# Patient Record
Sex: Male | Born: 1965 | Race: White | Hispanic: No | Marital: Married | Smoking: Never smoker
Health system: Southern US, Community
[De-identification: ages and names within clinical notes are randomized; demographics above are authoritative.]

## PROBLEM LIST (undated history)

## (undated) DIAGNOSIS — I10 Essential (primary) hypertension: Secondary | ICD-10-CM

## (undated) HISTORY — DX: Essential (primary) hypertension: I10

## (undated) HISTORY — PX: KNEE ARTHROSCOPY W/ ACL RECONSTRUCTION: SHX1858

---

## 2019-06-13 DIAGNOSIS — J329 Chronic sinusitis, unspecified: Secondary | ICD-10-CM | POA: Insufficient documentation

## 2020-04-06 ENCOUNTER — Telehealth: Payer: Self-pay | Admitting: Interventional Cardiology

## 2020-04-06 NOTE — Telephone Encounter (Signed)
Melissa from Urgent Care in [place] is faxing over an EKG for Dr. Eldridge Dace to look at. Please contact once reveiewed.

## 2020-04-06 NOTE — Telephone Encounter (Signed)
I spoke with Dr. Wende Crease.  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  No cardiac symptoms.  No old ECG available for comparison.   Patient with NSR and a nonspecific ST cahnge in the lateral leads.  No old ECG in our system for comparison.  May be his baseline tracing.  They could repeat a tracing in a few months and see if there was a change.  No acute cardiac testing needed in the absence of symptoms based on this tracing.

## 2020-06-29 ENCOUNTER — Other Ambulatory Visit: Payer: Self-pay

## 2020-06-29 ENCOUNTER — Ambulatory Visit (INDEPENDENT_AMBULATORY_CARE_PROVIDER_SITE_OTHER): Payer: BLUE CROSS/BLUE SHIELD | Admitting: Family Medicine

## 2020-06-29 VITALS — BP 134/81 | HR 62 | Temp 97.1°F | Resp 18 | Ht 71.0 in | Wt 161.8 lb

## 2020-06-29 DIAGNOSIS — N529 Male erectile dysfunction, unspecified: Secondary | ICD-10-CM

## 2020-06-29 DIAGNOSIS — I10 Essential (primary) hypertension: Secondary | ICD-10-CM

## 2020-06-29 DIAGNOSIS — Z23 Encounter for immunization: Secondary | ICD-10-CM

## 2020-06-29 MED ORDER — LOSARTAN POTASSIUM 100 MG PO TABS: 100.0000 mg | ORAL_TABLET | Freq: Every day | ORAL | 1 refills | Status: DC

## 2020-06-29 MED ORDER — TADALAFIL 10 MG PO TABS: 10.0000 mg | ORAL_TABLET | Freq: Every day | ORAL | 3 refills | Status: DC | PRN

## 2020-06-29 NOTE — Patient Instructions (Addendum)
I appreciate the opportunity to provide you with care for your health and wellness. Today we discussed: establish care   Follow up: Jan for annual visit-labs -fasting appt   No labs or referrals today Orders: Shingles second shot today  Great to meet you today!  Please take care and stay safe while traveling, and good luck with the B&B !  Please continue to practice social distancing to keep you, your family, and our community safe.  If you must go out, please wear a mask and practice good handwashing.  It was a pleasure to see you and I look forward to continuing to work together on your health and well-being. Please do not hesitate to call the office if you need care or have questions about your care.  Have a wonderful day and week. With Gratitude, Tereasa Coop, DNP, AGNP-BC

## 2020-06-29 NOTE — Progress Notes (Addendum)
Subjective:  Patient ID: URBAN NAVAL, male    DOB: October 04, 1966  Age: 54 y.o. MRN: [id number]  CC:  Chief Complaint  Patient presents with   New Patient (Initial Visit)    new pt former pt of nicole fredrickson in [place] just needs to get medication refill on bp med       HPI  HPI Mr. Cush is a 54 year old male patient who presents today to establish care.  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  He is running out of his blood pressure medication which is why he needs a sooner appointment than his scheduled.  He comes in today overall doing well without complaints.  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  He has had his Covid vaccines.  He reports today that he would like to get his shingles vaccine he had his first 1 back in January however he has not had his follow-up on.  He reports he would take the flu shot however he has an egg allergy that has recently developed more frequently into hives.  He says he did a Cologuard about 18 months ago and it was negative.  He does report that he has had HIV and hepatitis C screening and they were negative.  However he does not know the years that he had this done.  He denies having any sleep issues.  He does not see a dentist regularly.  But he has no trouble with chewing or swallowing or dentition changes.  He denies having any trouble going to the bathroom no blood in urine or stool.He is not having any signs of BPH or trouble using the restroom or starting stream.  He denies having any falls or injuries, brain trauma or memory trouble.  Reports some dry skin and the hives as discussed above with the egg allergy.  Does not eat eggs anymore.  Denies having any hearing or vision changes.  He reports he is an avid runner.  Overall has no complaints.  Today patient denies signs and symptoms of COVID 19 infection including fever, chills,  cough, shortness of breath, and headache. Past Medical, Surgical, Social History, Allergies, and Medications have been Reviewed.   Past Medical History:  Diagnosis Date   Hypertension     Current Meds  Medication Sig   losartan (COZAAR) 100 MG tablet Take 1 tablet (100 mg total) by mouth daily.   [DISCONTINUED] losartan (COZAAR) 100 MG tablet Take 100 mg by mouth daily.    ROS:  Review of Systems  Constitutional: Negative.   HENT: Negative.   Eyes: Negative.   Respiratory: Negative.   Cardiovascular: Negative.   Gastrointestinal: Negative.   Genitourinary: Negative.   Musculoskeletal: Negative.   Skin: Negative.   Neurological: Negative.   Endo/Heme/Allergies: Negative.   Psychiatric/Behavioral: Negative.      Objective:   Today's Vitals: BP 134/81 (BP Location: Right Arm, Patient Position: Sitting, Cuff Size: Normal)   Pulse 62   Temp (!) 97.1 F (36.2 C) (Temporal)   Resp 18   Ht 5\' 11"  (1.803 m)   Wt 161 lb 12.8 oz (73.4 kg)   SpO2 99%   BMI 22.57 kg/m  Vitals with BMI 06/29/2020  Height 5\' 11"   Weight 161 lbs 13 oz  BMI 22.58  Systolic 134  Diastolic 81  Pulse 62     Physical Exam  Vitals and nursing note reviewed.  Constitutional:      Appearance: Normal appearance. He is well-developed, well-groomed and normal weight.  HENT:     Head: Normocephalic and atraumatic.     Right Ear: External ear normal.     Left Ear: External ear normal.     Mouth/Throat:     Comments: Mask in place  Eyes:     General:        Right eye: No discharge.        Left eye: No discharge.     Conjunctiva/sclera: Conjunctivae normal.  Cardiovascular:     Rate and Rhythm: Normal rate and regular rhythm.     Pulses: Normal pulses.     Heart sounds: Normal heart sounds.  Pulmonary:     Effort: Pulmonary effort is normal.     Breath sounds: Normal breath sounds.  Musculoskeletal:        General: Normal range of motion.     Cervical back: Normal range of motion and neck  supple.  Skin:    General: Skin is warm.  Neurological:     General: No focal deficit present.     Mental Status: He is alert and oriented to person, place, and time.  Psychiatric:        Attention and Perception: Attention normal.        Mood and Affect: Mood normal.        Speech: Speech normal.        Behavior: Behavior normal. Behavior is cooperative.        Thought Content: Thought content normal.        Cognition and Memory: Cognition normal.        Judgment: Judgment normal.     Comments: Good communication and eye contact      Assessment   1. Essential hypertension   2. Erectile dysfunction, unspecified erectile dysfunction type   3. Need for shingles vaccine     Tests ordered Orders Placed This Encounter  Procedures   Varicella-zoster vaccine IM (Shingrix)     Plan: Please see assessment and plan per problem list above.   Meds ordered this encounter  Medications   losartan (COZAAR) 100 MG tablet    Sig: Take 1 tablet (100 mg total) by mouth daily.    Dispense:  90 tablet    Refill:  1    Order Specific Question:   Supervising Provider    Answer:   Lodema Hong, MARGARET E [[id number]]   tadalafil (CIALIS) 10 MG tablet    Sig: Take 1 tablet (10 mg total) by mouth daily as needed for erectile dysfunction.    Dispense:  10 tablet    Refill:  3    Order Specific Question:   Supervising Provider    Answer:   Kerri Perches [[id number]]    Patient to follow-up in Jan for annual.  Freddy Finner, NP

## 2020-08-21 ENCOUNTER — Other Ambulatory Visit (HOSPITAL_COMMUNITY)
Admission: RE | Admit: 2020-08-21 | Discharge: 2020-08-21 | Disposition: A | Discharge status: Home / Self Care | Payer: BC Managed Care – PPO | Source: Ambulatory Visit | Attending: Family Medicine | Admitting: Family Medicine

## 2020-08-21 ENCOUNTER — Ambulatory Visit (INDEPENDENT_AMBULATORY_CARE_PROVIDER_SITE_OTHER): Payer: Self-pay | Admitting: Family Medicine

## 2020-08-21 ENCOUNTER — Encounter: Payer: Self-pay | Admitting: Family Medicine

## 2020-08-21 ENCOUNTER — Other Ambulatory Visit: Payer: Self-pay

## 2020-08-21 ENCOUNTER — Ambulatory Visit (HOSPITAL_COMMUNITY)
Admission: RE | Admit: 2020-08-21 | Discharge: 2020-08-21 | Disposition: A | Discharge status: Home / Self Care | Payer: BC Managed Care – PPO | Source: Ambulatory Visit | Attending: Family Medicine | Admitting: Family Medicine

## 2020-08-21 VITALS — BP 118/66 | HR 92 | Ht 71.0 in | Wt 157.0 lb

## 2020-08-21 DIAGNOSIS — Z7184 Encounter for health counseling related to travel: Secondary | ICD-10-CM | POA: Diagnosis present

## 2020-08-21 DIAGNOSIS — I10 Essential (primary) hypertension: Secondary | ICD-10-CM

## 2020-08-21 NOTE — Progress Notes (Signed)
Subjective:  Patient ID: Clancy Mullarkey, male    DOB: Nov 18, 1965  Age: 55 y.o. MRN: [id number]  CC:  Chief Complaint  Patient presents with  . FYI    here to get cleared [place] go [place]      HPI  HPI  Mr Amores is a 54 year old male patient of mine.  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  He denies having any other issues or concerns.  He reports that he is resting well.  No trouble with chewing or swallowing.  No trouble using the restroom.  Denies having any chest pain, palpitations, leg swelling, fever or chills, cough, shortness of breath or headache.  Specific labs that he needs are titers for hepatitis B and C, malaria, filaria, syphilis and chest x-ray to rule out tuberculosis.  Today patient denies signs and symptoms of COVID 19 infection including fever, chills, cough, shortness of breath, and headache. Past Medical, Surgical, Social History, Allergies, and Medications have been Reviewed.   History reviewed. No pertinent past medical history.  Current Meds  Medication Sig  . losartan (COZAAR) 100 MG tablet Take 100 mg by mouth daily.    ROS:  Review of Systems  Constitutional: Negative.   HENT: Negative.   Eyes: Negative.   Respiratory: Negative.   Cardiovascular: Negative.   Gastrointestinal: Negative.   Genitourinary: Negative.   Musculoskeletal: Negative.   Skin: Negative.   Neurological: Negative.   Endo/Heme/Allergies: Negative.   Psychiatric/Behavioral: Negative.      Objective:   Today's Vitals: BP 118/66 (BP Location: Left Arm, Patient Position: Sitting, Cuff Size: Normal)   Pulse 92   Ht 5\' 11"  (1.803 m)   Wt 157 lb (71.2 kg)   SpO2 98%   BMI 21.90 kg/m  Vitals with BMI 08/21/2020 06/29/2020  Height 5\' 11"  5\' 11"   Weight 157 lbs 161 lbs 13 oz  BMI 21.91 22.58  Systolic 118 134  Diastolic 66 81  Pulse 92 62     Physical Exam Vitals and nursing note reviewed.  Constitutional:       Appearance: Normal appearance. He is well-developed, well-groomed and normal weight.  HENT:     Head: Normocephalic and atraumatic.     Right Ear: External ear normal.     Left Ear: External ear normal.     Mouth/Throat:     Comments: Mask in place Eyes:     General:        Right eye: No discharge.        Left eye: No discharge.     Conjunctiva/sclera: Conjunctivae normal.  Cardiovascular:     Rate and Rhythm: Normal rate and regular rhythm.     Pulses: Normal pulses.     Heart sounds: Normal heart sounds.  Pulmonary:     Effort: Pulmonary effort is normal.     Breath sounds: Normal breath sounds.  Musculoskeletal:        General: Normal range of motion.     Cervical back: Normal range of motion and neck supple.  Skin:    General: Skin is warm.  Neurological:     General: No focal deficit present.     Mental Status: He is alert and oriented to person, place, and time.  Psychiatric:        Attention and Perception: Attention normal.        Mood and Affect: Mood normal.        Speech:  Speech normal.        Behavior: Behavior normal. Behavior is cooperative.        Thought Content: Thought content normal.        Cognition and Memory: Cognition normal.        Judgment: Judgment normal.      Assessment   1. Encounter for counseling for travel     Tests ordered Orders Placed This Encounter  Procedures  . DG Chest 2 View  . Hepatitis C Antibody  . Hepatitis B Surface AntiBODY  . Parasite Exam, Blood  . RPR  . FilariaAntibody (IgG4)     Plan: Please see assessment and plan per problem list above.   No orders of the defined types were placed in this encounter.   Patient to follow-up in 11/16/2020  Note: This dictation was prepared with Dragon dictation along with smaller phrase technology. Similar sounding words can be transcribed inadequately or may not be corrected upon review. Any transcriptional errors that result from this process are unintentional.       Freddy Finner, NP

## 2020-08-21 NOTE — Assessment & Plan Note (Signed)
Controlled, continue current medication as directed.  DASH diet and exercise are recommended.

## 2020-08-21 NOTE — Addendum Note (Signed)
Addended by: Jerilynn Mages on: 08/21/2020 02:01 PM   Modules accepted: Orders

## 2020-08-21 NOTE — Patient Instructions (Addendum)
  HAPPY FALL!  I appreciate the opportunity to provide you with care for your health and wellness. Today we discussed: Work Scientist, research (life sciences)   Follow up: as scheduled  Labs- today at [facility]  No referrals today   Please continue to practice social distancing to keep you, your family, and our community safe.  If you must go out, please wear a mask and practice good handwashing.  It was a pleasure to see you and I look forward to continuing to work together on your health and well-being. Please do not hesitate to call the office if you need care or have questions about your care.  Have a wonderful day and week. With Gratitude, Tereasa Coop, DNP, AGNP-BC

## 2020-08-21 NOTE — Assessment & Plan Note (Signed)
Labs as per request with the sent paperwork ordered today.  No signs or symptoms of having any infection today.

## 2020-08-22 LAB — IGG 4: IgG, Subclass 4: 28 mg/dL (ref 2–96)

## 2020-08-22 LAB — HEPATITIS C ANTIBODY: HCV Ab: NONREACTIVE

## 2020-08-22 LAB — HEPATITIS B SURFACE ANTIBODY,QUALITATIVE: Hep B S Ab: REACTIVE — AB

## 2020-08-22 LAB — RPR: RPR Ser Ql: NONREACTIVE

## 2020-08-22 LAB — PARASITE EXAM SCREEN, BLOOD-W CONF TO LABCORP (NOT @ ARMC)

## 2020-08-23 LAB — PARASITE EXAM, BLOOD

## 2020-08-28 ENCOUNTER — Other Ambulatory Visit: Payer: Self-pay

## 2020-08-28 DIAGNOSIS — Z7184 Encounter for health counseling related to travel: Secondary | ICD-10-CM

## 2020-08-29 LAB — HIV ANTIBODY (ROUTINE TESTING W REFLEX): HIV 1&2 Ab, 4th Generation: NONREACTIVE

## 2020-08-29 LAB — FILARIA ANTIBODY (IGG4): Filaria Antibody (IgG4): 0.28

## 2020-11-13 ENCOUNTER — Encounter: Payer: Self-pay | Admitting: Family Medicine

## 2020-11-16 ENCOUNTER — Encounter: Payer: Self-pay | Admitting: Family Medicine

## 2020-11-30 ENCOUNTER — Encounter: Payer: Self-pay | Admitting: Family Medicine

## 2020-12-14 ENCOUNTER — Encounter: Payer: Self-pay | Admitting: Internal Medicine

## 2021-01-02 ENCOUNTER — Ambulatory Visit: Payer: Self-pay | Admitting: Family Medicine

## 2021-01-25 ENCOUNTER — Other Ambulatory Visit: Payer: Self-pay

## 2021-01-25 ENCOUNTER — Telehealth: Payer: Self-pay

## 2021-01-25 MED ORDER — LOSARTAN POTASSIUM 100 MG PO TABS
100.0000 mg | ORAL_TABLET | Freq: Every day | ORAL | 5 refills | Status: DC
Start: 2021-01-25 — End: 2021-05-09

## 2021-01-25 MED ORDER — TADALAFIL 10 MG PO TABS
10.0000 mg | ORAL_TABLET | Freq: Every day | ORAL | 0 refills | Status: DC | PRN
Start: 2021-01-25 — End: 2021-03-13

## 2021-01-25 NOTE — Telephone Encounter (Signed)
Patient called need med refill  losartan (COZAAR) 100 MG tablet   Cailis 10 mg  Pharmacy:  [facility]

## 2021-03-13 ENCOUNTER — Other Ambulatory Visit: Payer: Self-pay | Admitting: Family Medicine

## 2021-04-10 ENCOUNTER — Other Ambulatory Visit: Payer: Self-pay | Admitting: Nurse Practitioner

## 2021-05-03 ENCOUNTER — Ambulatory Visit: Payer: BC Managed Care – PPO | Admitting: Family Medicine

## 2021-05-09 ENCOUNTER — Encounter: Payer: Self-pay | Admitting: Nurse Practitioner

## 2021-05-09 ENCOUNTER — Other Ambulatory Visit: Payer: Self-pay

## 2021-05-09 ENCOUNTER — Ambulatory Visit (INDEPENDENT_AMBULATORY_CARE_PROVIDER_SITE_OTHER): Payer: BC Managed Care – PPO | Admitting: Nurse Practitioner

## 2021-05-09 VITALS — BP 146/84 | HR 60 | Temp 97.0°F | Ht 71.0 in | Wt 156.0 lb

## 2021-05-09 DIAGNOSIS — E785 Hyperlipidemia, unspecified: Secondary | ICD-10-CM

## 2021-05-09 DIAGNOSIS — I10 Essential (primary) hypertension: Secondary | ICD-10-CM

## 2021-05-09 DIAGNOSIS — Z0001 Encounter for general adult medical examination with abnormal findings: Secondary | ICD-10-CM

## 2021-05-09 DIAGNOSIS — N529 Male erectile dysfunction, unspecified: Secondary | ICD-10-CM

## 2021-05-09 DIAGNOSIS — Z1211 Encounter for screening for malignant neoplasm of colon: Secondary | ICD-10-CM | POA: Diagnosis not present

## 2021-05-09 MED ORDER — LOSARTAN POTASSIUM 100 MG PO TABS: 100.0000 mg | ORAL_TABLET | Freq: Every day | ORAL | 3 refills | Status: DC

## 2021-05-09 MED ORDER — TADALAFIL 5 MG PO TABS: 5.0000 mg | ORAL_TABLET | Freq: Every day | ORAL | 1 refills | Status: DC

## 2021-05-09 NOTE — Patient Instructions (Signed)
BP was slightly elevated at 146/84. The goal BP is < 140/90. Check your BP occasionally at home, and if it is > 140/90, we may need to add more medication.  We will check cholesterol as well as PSA with your labs today.

## 2021-05-09 NOTE — Assessment & Plan Note (Signed)
-  takes losartan 100 mg daily

## 2021-05-09 NOTE — Progress Notes (Signed)
Established Patient Office Visit  Subjective:  Patient ID: Richard Matthews, male    DOB: 02-14-1966  Age: 55 y.o. MRN: [id number]  CC:  Chief Complaint  Patient presents with   Annual Exam    CPE    HPI [name] presents for physical exam.  Wants to swap to daily 5 mg cialis.  No acute concerns.   No recent labs.  Past Medical History:  Diagnosis Date   Hypertension     Past Surgical History:  Procedure Laterality Date   KNEE ARTHROSCOPY W/ ACL RECONSTRUCTION Right     Family History  Problem Relation Age of Onset   AAA (abdominal aortic aneurysm) Mother    Cancer Father     Social History   Socioeconomic History   Marital status: Married    Spouse name: Not on file   Number of children: 2   Years of education: Not on file   Highest education level: Not on file  Occupational History   Occupation: Runs an Building surveyor  Tobacco Use   Smoking status: Never   Smokeless tobacco: Never  Vaping Use   Vaping Use: Never used  Substance and Sexual Activity   Alcohol use: Yes    Comment: 15 per week; at least 2 per night   Drug use: Yes    Types: Marijuana   Sexual activity: Yes  Other Topics Concern   Not on file  Social History Narrative   Not on file   Social Determinants of Health   Financial Resource Strain: Low Risk    Difficulty of Paying Living Expenses: Not hard at all  Food Insecurity: No Food Insecurity   Worried About Charity fundraiser in the Last Year: Never true   [place] in the Last Year: Never true  Transportation Needs: No Transportation Needs   Lack of Transportation (Medical): No   Lack of Transportation (Non-Medical): No  Physical Activity: Sufficiently Active   Days of Exercise per Week: 7 days   Minutes of Exercise per Session: 40 min  Stress: No Stress Concern Present   Feeling of Stress : Not at all  Social Connections: Moderately Isolated   Frequency of Communication with Friends and Family:  More than three times a week   Frequency of Social Gatherings with Friends and Family: More than three times a week   Attends Religious Services: Never   Marine scientist or Organizations: No   Attends Music therapist: Never   Marital Status: Married  Human resources officer Violence: Not At Risk   Fear of Current or Ex-Partner: No   Emotionally Abused: No   Physically Abused: No   Sexually Abused: No    Outpatient Medications Prior to Visit  Medication Sig Dispense Refill   losartan (COZAAR) 100 MG tablet Take 1 tablet (100 mg total) by mouth daily. 30 tablet 5   tadalafil (CIALIS) 10 MG tablet TAKE 1 TABLET BY MOUTH ONCE DAILY AS NEEDED 10 tablet 0   No facility-administered medications prior to visit.    Allergies  Allergen Reactions   Levofloxacin Hives   Other Hives   Eggs Or Egg-Derived Products Hives    ROS Review of Systems    Objective:    Physical Exam  BP (!) 146/84 (BP Location: Left Arm, Patient Position: Sitting, Cuff Size: Large)   Pulse 60   Temp (!) 97 F (36.1 C) (Temporal)   Ht '5\' 11"'  (1.803 m)   Wt  156 lb (70.8 kg)   SpO2 98%   BMI 21.76 kg/m  Wt Readings from Last 3 Encounters:  05/09/21 156 lb (70.8 kg)  08/21/20 157 lb (71.2 kg)  06/29/20 161 lb 12.8 oz (73.4 kg)     Health Maintenance Due  Topic Date Due   TETANUS/TDAP  Never done   COLONOSCOPY (Pts 45-73yr Insurance coverage will need to be confirmed)  Never done    There are no preventive care reminders to display for this patient.  No results found for: TSH No results found for: WBC, HGB, HCT, MCV, PLT No results found for: NA, K, CHLORIDE, CO2, GLUCOSE, BUN, CREATININE, BILITOT, ALKPHOS, AST, ALT, PROT, ALBUMIN, CALCIUM, ANIONGAP, EGFR, GFR No results found for: CHOL No results found for: HDL No results found for: LDLCALC No results found for: TRIG No results found for: CHOLHDL No results found for: HGBA1C    Assessment & Plan:   Problem List Items  Addressed This Visit       Cardiovascular and Mediastinum   Essential hypertension    -takes losartan 100 mg daily       Relevant Medications   losartan (COZAAR) 100 MG tablet   tadalafil (CIALIS) 5 MG tablet   Other Relevant Orders   CBC with Differential/Platelet   CMP14+EGFR   Lipid Panel With LDL/HDL Ratio     Other   Erectile dysfunction    -takes tadalafil 10 mg PRN       Relevant Medications   tadalafil (CIALIS) 5 MG tablet   Encounter for general adult medical examination with abnormal findings - Primary    -BP slightly elevated today       Relevant Orders   CBC with Differential/Platelet   CMP14+EGFR   Lipid Panel With LDL/HDL Ratio   TSH   Cologuard   PSA   HLD (hyperlipidemia)    -drawing labs today -he states cholesterol is usually 240; no recent labs to compare       Relevant Medications   losartan (COZAAR) 100 MG tablet   tadalafil (CIALIS) 5 MG tablet   Other Visit Diagnoses     Screen for colon cancer       Relevant Orders   Cologuard       Meds ordered this encounter  Medications   losartan (COZAAR) 100 MG tablet    Sig: Take 1 tablet (100 mg total) by mouth daily.    Dispense:  90 tablet    Refill:  3   tadalafil (CIALIS) 5 MG tablet    Sig: Take 1 tablet (5 mg total) by mouth daily.    Dispense:  90 tablet    Refill:  1     Follow-up: Return in about 1 year (around 05/09/2022) for Physical Exam.    JNoreene Larsson NP

## 2021-05-09 NOTE — Assessment & Plan Note (Signed)
-  takes tadalafil 10 mg PRN

## 2021-05-09 NOTE — Assessment & Plan Note (Signed)
-  drawing labs today -he states cholesterol is usually 240; no recent labs to compare

## 2021-05-09 NOTE — Assessment & Plan Note (Signed)
-  BP slightly elevated today

## 2021-05-10 LAB — CMP14+EGFR
ALT: 22 IU/L (ref 0–44)
AST: 23 IU/L (ref 0–40)
Albumin/Globulin Ratio: 2.8 — ABNORMAL HIGH (ref 1.2–2.2)
Albumin: 4.7 g/dL (ref 3.8–4.9)
Alkaline Phosphatase: 56 IU/L (ref 44–121)
BUN/Creatinine Ratio: 16 (ref 9–20)
BUN: 16 mg/dL (ref 6–24)
Bilirubin Total: 0.4 mg/dL (ref 0.0–1.2)
CO2: 26 mmol/L (ref 20–29)
Calcium: 9.3 mg/dL (ref 8.7–10.2)
Chloride: 98 mmol/L (ref 96–106)
Creatinine, Ser: 0.97 mg/dL (ref 0.76–1.27)
Globulin, Total: 1.7 g/dL (ref 1.5–4.5)
Glucose: 102 mg/dL — ABNORMAL HIGH (ref 65–99)
Potassium: 5.2 mmol/L (ref 3.5–5.2)
Sodium: 138 mmol/L (ref 134–144)
Total Protein: 6.4 g/dL (ref 6.0–8.5)
eGFR: 92 mL/min/{1.73_m2} (ref >59)

## 2021-05-10 LAB — CBC WITH DIFFERENTIAL/PLATELET
Basophils Absolute: 0.1 10*3/uL (ref 0.0–0.2)
Basos: 1 %
EOS (ABSOLUTE): 0.3 10*3/uL (ref 0.0–0.4)
Eos: 5 %
Hematocrit: 46.6 % (ref 37.5–51.0)
Hemoglobin: 15.5 g/dL (ref 13.0–17.7)
Immature Grans (Abs): 0 10*3/uL (ref 0.0–0.1)
Immature Granulocytes: 0 %
Lymphocytes Absolute: 1.4 10*3/uL (ref 0.7–3.1)
Lymphs: 25 %
MCH: 31 pg (ref 26.6–33.0)
MCHC: 33.3 g/dL (ref 31.5–35.7)
MCV: 93 fL (ref 79–97)
Monocytes Absolute: 0.6 10*3/uL (ref 0.1–0.9)
Monocytes: 10 %
Neutrophils Absolute: 3.2 10*3/uL (ref 1.4–7.0)
Neutrophils: 59 %
Platelets: 144 10*3/uL — ABNORMAL LOW (ref 150–450)
RBC: 5 x10E6/uL (ref 4.14–5.80)
RDW: 12.8 % (ref 11.6–15.4)
WBC: 5.5 10*3/uL (ref 3.4–10.8)

## 2021-05-10 LAB — TSH: TSH: 2.44 u[IU]/mL (ref 0.450–4.500)

## 2021-05-10 LAB — LIPID PANEL WITH LDL/HDL RATIO
Cholesterol, Total: 219 mg/dL — ABNORMAL HIGH (ref 100–199)
HDL: 66 mg/dL (ref >39)
LDL Chol Calc (NIH): 140 mg/dL — ABNORMAL HIGH (ref 0–99)
LDL/HDL Ratio: 2.1 ratio (ref 0.0–3.6)
Triglycerides: 72 mg/dL (ref 0–149)
VLDL Cholesterol Cal: 13 mg/dL (ref 5–40)

## 2021-05-10 LAB — PSA: Prostate Specific Ag, Serum: 1.6 ng/mL (ref 0.0–4.0)

## 2021-05-10 NOTE — Progress Notes (Signed)
LDL, or bad cholesterol, is 140, and we would like this under 100. Would he like me to send in a low dose of atorvastatin to help bring this down?  Glucose was 102, which is slightly elevated, so I added an A1c to screen for diabetes.

## 2021-05-14 LAB — SPECIMEN STATUS REPORT

## 2021-05-14 LAB — HGB A1C W/O EAG: Hgb A1c MFr Bld: 5.6 % (ref 4.8–5.6)

## 2021-05-14 NOTE — Progress Notes (Signed)
A1c is 5.6, so he isn't even prediabetic.

## 2021-05-21 ENCOUNTER — Telehealth: Payer: Self-pay | Admitting: Nurse Practitioner

## 2021-05-21 NOTE — Telephone Encounter (Signed)
Pt is calling regarding statin medication , can someone call him

## 2021-05-21 NOTE — Telephone Encounter (Signed)
Left message

## 2021-05-23 NOTE — Telephone Encounter (Signed)
Patient called said please send in whatever statin medication he needs to [facility]

## 2021-05-27 ENCOUNTER — Other Ambulatory Visit: Payer: Self-pay | Admitting: Nurse Practitioner

## 2021-05-27 DIAGNOSIS — E785 Hyperlipidemia, unspecified: Secondary | ICD-10-CM

## 2021-05-27 MED ORDER — ATORVASTATIN CALCIUM 20 MG PO TABS: 20.0000 mg | ORAL_TABLET | Freq: Every day | ORAL | 3 refills | Status: DC

## 2021-05-27 NOTE — Telephone Encounter (Signed)
I sent in low-dose atorvastatin.

## 2021-05-28 NOTE — Telephone Encounter (Signed)
Left message

## 2021-05-29 ENCOUNTER — Encounter: Payer: Self-pay | Admitting: Nurse Practitioner

## 2021-05-29 NOTE — Telephone Encounter (Signed)
Left detailed message informing pt.

## 2021-06-04 LAB — COLOGUARD

## 2021-08-15 ENCOUNTER — Other Ambulatory Visit: Payer: Self-pay | Admitting: Internal Medicine

## 2021-08-15 ENCOUNTER — Encounter: Payer: Self-pay | Admitting: Nurse Practitioner

## 2021-08-15 DIAGNOSIS — N529 Male erectile dysfunction, unspecified: Secondary | ICD-10-CM

## 2021-08-15 MED ORDER — SILDENAFIL CITRATE 100 MG PO TABS: 50.0000 mg | ORAL_TABLET | Freq: Every day | ORAL | 11 refills | Status: DC | PRN

## 2021-10-14 IMAGING — DX DG CHEST 2V
2 series · 2 of 2 positions shown · non-contrast
Comparison: None.

CLINICAL DATA: To rule out TB for work the subtract vol. History of
hypertension.

EXAM:
CHEST - 2 VIEW

[chest pa]
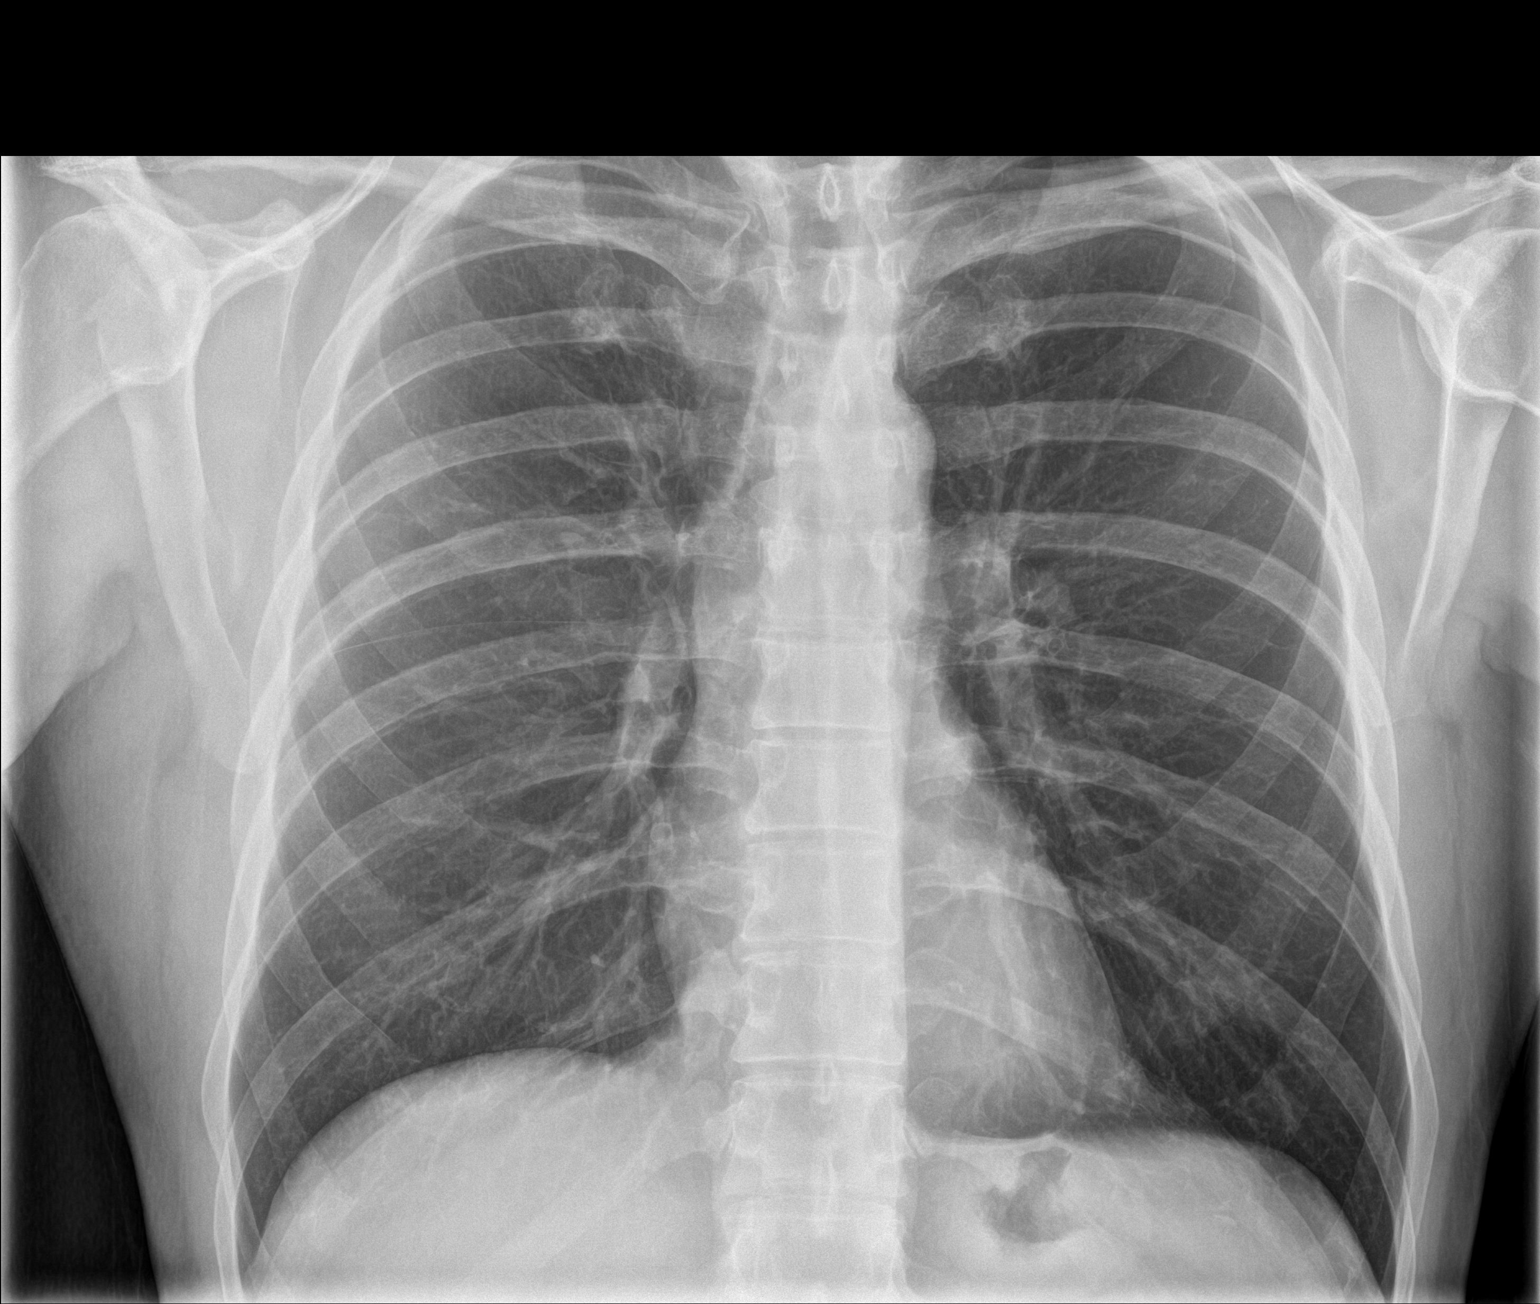

[chest lat]
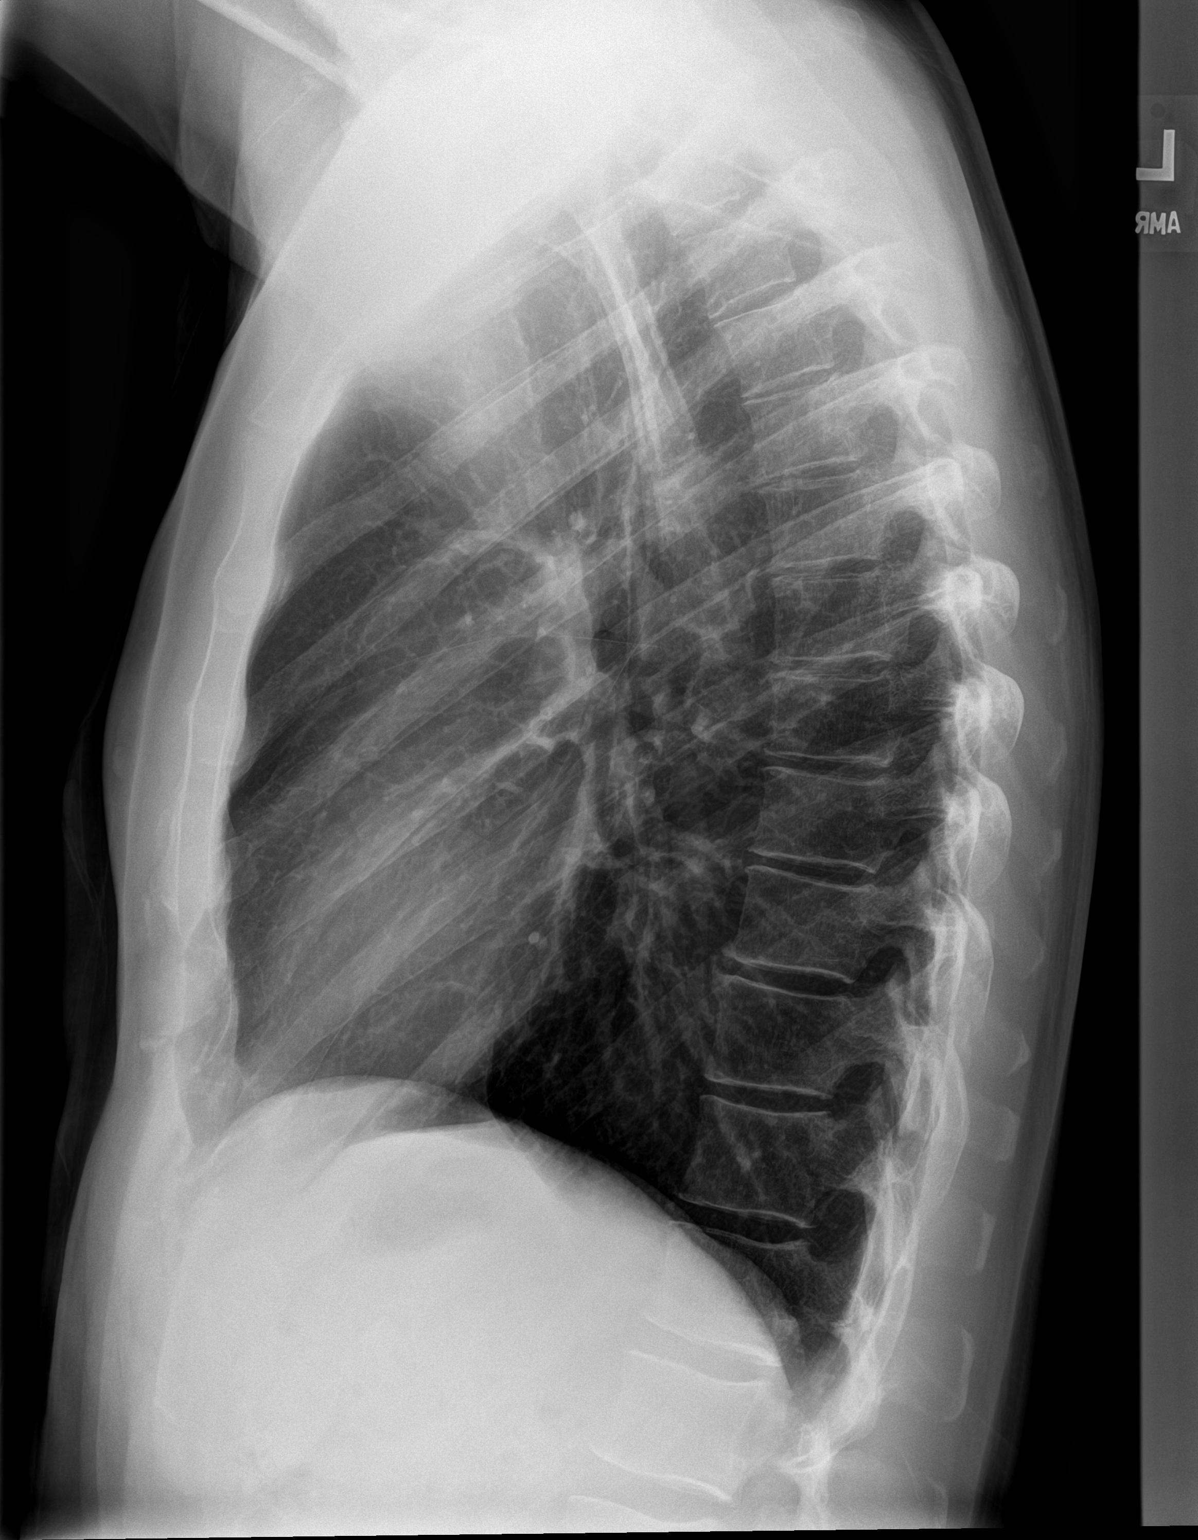

[2 of 2 positions shown; findings below may reference images not displayed]

FINDINGS: Hyperinflation in the lungs. Heart size and pulmonary vascularity
are normal. Lungs are clear. No pleural effusions. No pneumothorax.
No pleural thickening, nodular infiltrates, or consolidation to
suggest active TB. Mediastinal contours appear intact.
IMPRESSION: Hyperinflation. No evidence of active pulmonary disease. No evidence
of active TB.

## 2022-03-28 ENCOUNTER — Ambulatory Visit (INDEPENDENT_AMBULATORY_CARE_PROVIDER_SITE_OTHER): Payer: Managed Care, Other (non HMO) | Admitting: Nurse Practitioner

## 2022-03-28 ENCOUNTER — Encounter: Payer: Self-pay | Admitting: Nurse Practitioner

## 2022-03-28 VITALS — BP 124/74 | HR 59 | Ht 71.0 in | Wt 157.0 lb

## 2022-03-28 DIAGNOSIS — E785 Hyperlipidemia, unspecified: Secondary | ICD-10-CM

## 2022-03-28 DIAGNOSIS — M25561 Pain in right knee: Secondary | ICD-10-CM

## 2022-03-28 DIAGNOSIS — I10 Essential (primary) hypertension: Secondary | ICD-10-CM

## 2022-03-28 MED ORDER — IBUPROFEN 600 MG PO TABS: 600.0000 mg | ORAL_TABLET | Freq: Three times a day (TID) | ORAL | 0 refills | Status: DC | PRN

## 2022-03-28 NOTE — Patient Instructions (Signed)
Pleas take ibuprofen 600 mg every 8 hours as needed for your knee pain    It is important that you exercise regularly at least 30 minutes 5 times a week.  Think about what you will eat, plan ahead. Choose " clean, green, fresh or frozen" over canned, processed or packaged foods which are more sugary, salty and fatty. 70 to 75% of food eaten should be vegetables and fruit. Three meals at set times with snacks allowed between meals, but they must be fruit or vegetables. Aim to eat over a 12 hour period , example 7 am to 7 pm, and STOP after  your last meal of the day. Drink water,generally about 64 ounces per day, no other drink is as healthy. Fruit juice is best enjoyed in a healthy way, by EATING the fruit.  Thanks for choosing [facility], we consider it a privelige to serve you.

## 2022-03-28 NOTE — Assessment & Plan Note (Signed)
Acute pain no effusion noted Start ibuprofen 600 mg 3 times daily as needed Will refer to orthopedics if pain does not improve

## 2022-03-28 NOTE — Progress Notes (Addendum)
   Richard Matthews     MRN: [id number]      DOB: 08-May-1966   HPI Richard Matthews is here for complaints of right knee joint pain and swelling for the past 2 weeks.  Patient stated that had ACL repair surgery 13 years ago, after  he was was involved in a skiing accident.  Patient stated that he has not taken any medication yet for his pain. Has shotting pain 4/10 with flexion of the knee.  Patient denies fever, chills, malaise numbness, tingling.  Hypertension.  Currently taking losartan 100 mg daily by mouth, reports that he has been taking medication as prescribed.  Denies chest pain, syncope, edema  Taking atorvastatin 20 mg daily for hyperlipidemia.  Patient states that he has had Tdap vaccine in the past 10 years, due for colon cancer screening states that he has received Cologuard testing kits and would have the test done soon.   ROS Denies recent fever or chills. Denies sinus pressure, nasal congestion, ear pain or sore throat. Denies chest congestion, productive cough or wheezing. Denies chest pains, palpitations and leg swelling Denies abdominal pain, nausea, vomiting,diarrhea or constipation.   Denies depression, anxiety or insomnia.    PE  BP 124/74 (BP Location: Right Arm, Patient Position: Sitting, Cuff Size: Large)   Pulse (!) 59   Ht 5\' 11"  (1.803 m)   Wt 157 lb (71.2 kg)   SpO2 95%   BMI 21.90 kg/m   Patient alert and oriented and in no cardiopulmonary distress.  Chest: Clear to auscultation bilaterally.  CVS: S1, S2 no murmurs, no S3.Regular rate.  ABD: Soft non tender.   Ext: No edema  MS: Adequate ROM spine, shoulders, hips and knees  Psych: Good eye contact, normal affect. Memory intact not anxious or depressed appearing.  CNS: CN 2-12 intact, power,  normal throughout.no focal deficits noted.   Assessment & Plan  Essential hypertension BP Readings from Last 3 Encounters:  03/28/22 124/74  05/09/21 (!) 146/84  08/21/20 118/66  Chronic  condition well-controlled on losartan 100 mg daily Continue current medication DASH diet advised engage in regular daily exercises at least 150 minutes weekly CMP ordered  HLD (hyperlipidemia) Currently on atorvastatin 20 mg daily Check lipid panel Avoid fatty fried foods  Acute pain of right knee Acute pain no effusion noted Start ibuprofen 600 mg 3 times daily as needed Will refer to orthopedics if pain does not improve

## 2022-03-28 NOTE — Assessment & Plan Note (Signed)
Currently on atorvastatin 20 mg daily Check lipid panel Avoid fatty fried foods 

## 2022-03-28 NOTE — Assessment & Plan Note (Signed)
BP Readings from Last 3 Encounters:  03/28/22 124/74  05/09/21 (!) 146/84  08/21/20 118/66  Chronic condition well-controlled on losartan 100 mg daily Continue current medication DASH diet advised engage in regular daily exercises at least 150 minutes weekly CMP ordered

## 2022-04-01 ENCOUNTER — Telehealth: Payer: Self-pay | Admitting: Nurse Practitioner

## 2022-04-01 ENCOUNTER — Other Ambulatory Visit: Payer: Self-pay | Admitting: Nurse Practitioner

## 2022-04-01 DIAGNOSIS — M25561 Pain in right knee: Secondary | ICD-10-CM

## 2022-04-01 NOTE — Telephone Encounter (Signed)
Please advise 

## 2022-04-01 NOTE — Telephone Encounter (Signed)
Pt came into office rt knee pain is getting worse & is wanting to know if he can please get a referral to [facility]? Prefers [place].

## 2022-04-01 NOTE — Progress Notes (Unsigned)
Amb

## 2022-04-01 NOTE — Telephone Encounter (Signed)
Spoke with pt advised referral placed, pt verbalized understanding

## 2022-04-02 LAB — LIPID PANEL
Chol/HDL Ratio: 2.4 ratio (ref 0.0–5.0)
Cholesterol, Total: 146 mg/dL (ref 100–199)
HDL: 61 mg/dL (ref >39)
LDL Chol Calc (NIH): 71 mg/dL (ref 0–99)
Triglycerides: 71 mg/dL (ref 0–149)
VLDL Cholesterol Cal: 14 mg/dL (ref 5–40)

## 2022-04-02 LAB — CMP14+EGFR
ALT: 21 IU/L (ref 0–44)
AST: 23 IU/L (ref 0–40)
Albumin/Globulin Ratio: 2 (ref 1.2–2.2)
Albumin: 4.2 g/dL (ref 3.8–4.9)
Alkaline Phosphatase: 59 IU/L (ref 44–121)
BUN/Creatinine Ratio: 10 (ref 9–20)
BUN: 12 mg/dL (ref 6–24)
Bilirubin Total: 0.4 mg/dL (ref 0.0–1.2)
CO2: 25 mmol/L (ref 20–29)
Calcium: 9.1 mg/dL (ref 8.7–10.2)
Chloride: 101 mmol/L (ref 96–106)
Creatinine, Ser: 1.15 mg/dL (ref 0.76–1.27)
Globulin, Total: 2.1 g/dL (ref 1.5–4.5)
Glucose: 93 mg/dL (ref 70–99)
Potassium: 4.7 mmol/L (ref 3.5–5.2)
Sodium: 140 mmol/L (ref 134–144)
Total Protein: 6.3 g/dL (ref 6.0–8.5)
eGFR: 74 mL/min/{1.73_m2} (ref >59)

## 2022-04-02 NOTE — Progress Notes (Signed)
Normal labs continue current medications.

## 2022-04-03 LAB — COLOGUARD: Cologuard: NEGATIVE

## 2022-04-07 ENCOUNTER — Encounter: Payer: Self-pay | Admitting: Orthopedic Surgery

## 2022-04-07 ENCOUNTER — Ambulatory Visit (INDEPENDENT_AMBULATORY_CARE_PROVIDER_SITE_OTHER): Payer: Managed Care, Other (non HMO) | Admitting: Orthopedic Surgery

## 2022-04-07 ENCOUNTER — Ambulatory Visit: Payer: Managed Care, Other (non HMO)

## 2022-04-07 VITALS — BP 130/74 | HR 54 | Ht 71.0 in | Wt 156.0 lb

## 2022-04-07 DIAGNOSIS — M25462 Effusion, left knee: Secondary | ICD-10-CM

## 2022-04-07 DIAGNOSIS — M25561 Pain in right knee: Secondary | ICD-10-CM

## 2022-04-07 NOTE — Progress Notes (Signed)
Chief Complaint  Patient presents with   Knee Pain    Right knee pain   This is a 56 year old male status post ACL reconstruction in 2008 with an allograft.  He also notes that he had some meniscal work done and his MCL was injured but healed primarily without repair  He was initially injured in a skiing accident requiring the surgery  He was doing well up until he built a ramp in his backyard and since that time he has had severe pain medial side swelling decreased range of motion which is relieved with 600 mg of ibuprofen but if he does not take the ibuprofen he notes severe pain again  He does not feel instability  His examination is notable for 2+ effusion he has full if not hyperextension and flexes the knee to 105 degrees he has a stable anterior drawer test normal Lachman test no collateral ligament instability but has some medial joint line tenderness along the meniscal rim  X-rays of the right knee 2 screws in good position from acl surgery, no real arthritic changes.  Assessment  56 year old male successful ACL reconstruction in 2008 presents now with atraumatic onset of pain in his right knee associated with swelling and decreased range of motion after he built a deck  Diagnoses  Encounter Diagnoses  Name Primary?   Acute pain of right knee Yes   Effusion of knee joint, left      Plan  Procedure note injection and aspiration right knee joint  Verbal consent was obtained to aspirate and inject the right knee joint   Timeout was completed to confirm the site of aspiration and injection  An 18-gauge needle was used to aspirate the knee joint from a suprapatellar lateral approach.  The medications used were 40 mg of Depo-Medrol and 1% lidocaine 3 cc  Anesthesia was provided by ethyl chloride and the skin was prepped with alcohol.  After cleaning the skin with alcohol an 18-gauge needle was used to aspirate the right knee joint.  We obtained 20  cc of fluid clr yel  thick   We follow this by injection of 40 mg of Depo-Medrol and 3 cc 1% lidocaine.  There were no complications. A sterile bandage was applied.   Follow-up in 2 weeks  Recommend he continue to ice and take ibuprofen since it seems to be taking care of the pain

## 2022-04-07 NOTE — Patient Instructions (Signed)

## 2022-04-10 ENCOUNTER — Encounter: Payer: Self-pay | Admitting: *Deleted

## 2022-04-15 ENCOUNTER — Ambulatory Visit: Payer: Managed Care, Other (non HMO) | Admitting: Orthopedic Surgery

## 2022-04-21 ENCOUNTER — Ambulatory Visit (INDEPENDENT_AMBULATORY_CARE_PROVIDER_SITE_OTHER): Payer: Managed Care, Other (non HMO) | Admitting: Orthopedic Surgery

## 2022-04-21 DIAGNOSIS — M25561 Pain in right knee: Secondary | ICD-10-CM

## 2022-04-21 DIAGNOSIS — M25461 Effusion, right knee: Secondary | ICD-10-CM | POA: Diagnosis not present

## 2022-04-21 NOTE — Patient Instructions (Addendum)
MRI RIGHT KNEE   Call and schedule your MRI for 2 weeks from now. We will work on getting it approved through [employer].  Central Scheduling [phone]

## 2022-04-21 NOTE — Progress Notes (Signed)
Chief Complaint  Patient presents with   Knee Pain    RT knee/ pain is worse Hx of surgery 15 years ago   Richard Matthews is 56 years old he had an ACL reconstruction with an allograft in 2008 he had some meniscal work done and tore his MCL as well.  He presented with pain and swelling after building a ramp in his backyard.  He took ibuprofen for several weeks presented with a 2+ effusion flexion only to 105 degrees.  His ACL felt stable at the time collateral ligaments were stable.  We aspirated 20 cc of clear yellow fluid injected him with Depo-Medrol 40 mg.  He was good for about 10 days and then despite ice and ibuprofen and the swelling returned  Right knee effusion, flexion arc is decreased at 105 degrees,  Recommend MRI right knee  Communicate thru my chart results  Encounter Diagnosis  Name Primary?   Effusion, right knee Yes

## 2022-04-22 ENCOUNTER — Ambulatory Visit (HOSPITAL_COMMUNITY)
Admission: RE | Admit: 2022-04-22 | Discharge: 2022-04-22 | Disposition: A | Discharge status: Home / Self Care | Payer: Managed Care, Other (non HMO) | Source: Ambulatory Visit | Attending: Orthopedic Surgery | Admitting: Orthopedic Surgery

## 2022-04-22 DIAGNOSIS — M25461 Effusion, right knee: Secondary | ICD-10-CM

## 2022-04-22 DIAGNOSIS — M25561 Pain in right knee: Secondary | ICD-10-CM

## 2022-05-09 ENCOUNTER — Telehealth: Payer: Self-pay | Admitting: Radiology

## 2022-05-09 NOTE — Telephone Encounter (Signed)
Right knee arthroscopy with medial menisectomy on 7/21 at 730   No more cases that day bec of clinic

## 2022-05-09 NOTE — Telephone Encounter (Signed)
Patient called, received MyChart message from Dr Romeo Apple, wants to discuss scheduling surgery.  If possible wants to do on July 20th or 21st.  If not will have to wait until early August.  Please call him to discuss.  Thanks.

## 2022-05-14 ENCOUNTER — Other Ambulatory Visit: Payer: Self-pay | Admitting: Orthopedic Surgery

## 2022-05-14 ENCOUNTER — Encounter: Payer: Self-pay | Admitting: Orthopedic Surgery

## 2022-05-14 DIAGNOSIS — S83241D Other tear of medial meniscus, current injury, right knee, subsequent encounter: Secondary | ICD-10-CM

## 2022-05-14 DIAGNOSIS — Z01818 Encounter for other preprocedural examination: Secondary | ICD-10-CM

## 2022-05-14 DIAGNOSIS — I10 Essential (primary) hypertension: Secondary | ICD-10-CM

## 2022-05-14 NOTE — Telephone Encounter (Signed)
Sent him the preop information Put in order s

## 2022-05-16 ENCOUNTER — Other Ambulatory Visit: Payer: Self-pay | Admitting: Orthopedic Surgery

## 2022-05-16 ENCOUNTER — Telehealth: Payer: Self-pay | Admitting: Radiology

## 2022-05-16 NOTE — Telephone Encounter (Signed)
Richard Matthews has denied coverage of the knee scope planned for 05/23/22 he has to wait 3 months after he first saw you for treatment  There is a peer to peer available do you want me to set up a peer to peer or let him know he has to wait 2 more months?

## 2022-05-16 NOTE — Telephone Encounter (Signed)
Peer to peer

## 2022-05-19 ENCOUNTER — Encounter: Payer: Self-pay | Admitting: Radiology

## 2022-05-19 ENCOUNTER — Other Ambulatory Visit: Payer: Self-pay | Admitting: Orthopedic Surgery

## 2022-05-19 DIAGNOSIS — S83241D Other tear of medial meniscus, current injury, right knee, subsequent encounter: Secondary | ICD-10-CM

## 2022-05-20 NOTE — Patient Instructions (Addendum)
Richard Matthews  05/20/2022     @PREFPERIOPPHARMACY @   Your procedure is scheduled on  05/23/2022.   Report to [facility] at  0600  A.M.   Call this number if you have problems the morning of surgery:  [phone]   Remember:  Do not eat or drink after midnight.      Take these medicines the morning of surgery with A SIP OF WATER                                           None.     Do not wear jewelry, make-up or nail polish.  Do not wear lotions, powders, or perfumes, or deodorant.  Do not shave 48 hours prior to surgery.  Men may shave face and neck.  Do not bring valuables to the hospital.  [facility] is not responsible for any belongings or valuables.  Contacts, dentures or bridgework may not be worn into surgery.  Leave your suitcase in the car.  After surgery it may be brought to your room.  For patients admitted to the hospital, discharge time will be determined by your treatment team.  Patients discharged the day of surgery will not be allowed to drive home and must have someone with them for 24 hours.    Special instructions:   DO NOT smoke tobacco or vape for 24 hours before your procedure.  Please read over the following fact sheets that you were given. Coughing and Deep Breathing, Surgical Site Infection Prevention, Anesthesia Post-op Instructions, and Care and Recovery After Surgery       General Anesthesia, Adult, Care After This sheet gives you information about how to care for yourself after your procedure. Your health care provider may also give you more specific instructions. If you have problems or questions, contact your health care provider. What can I expect after the procedure? After the procedure, the following side effects are common: Pain or discomfort at the IV site. Nausea. Vomiting. Sore throat. Trouble concentrating. Feeling cold or chills. Feeling weak or tired. Sleepiness and fatigue. Soreness and body aches.  These side effects can affect parts of the body that were not involved in surgery. Follow these instructions at home: For the time period you were told by your health care provider:  Rest. Do not participate in activities where you could fall or become injured. Do not drive or use machinery. Do not drink alcohol. Do not take sleeping pills or medicines that cause drowsiness. Do not make important decisions or sign legal documents. Do not take care of children on your own. Eating and drinking Follow any instructions from your health care provider about eating or drinking restrictions. When you feel hungry, start by eating small amounts of foods that are soft and easy to digest (bland), such as toast. Gradually return to your regular diet. Drink enough fluid to keep your urine pale yellow. If you vomit, rehydrate by drinking water, juice, or clear broth. General instructions If you have sleep apnea, surgery and certain medicines can increase your risk for breathing problems. Follow instructions from your health care provider about wearing your sleep device: Anytime you are sleeping, including during daytime naps. While taking prescription pain medicines, sleeping medicines, or medicines that make you drowsy. Have a responsible adult stay with you for the time you are told.  It is important to have someone help care for you until you are awake and alert. Return to your normal activities as told by your health care provider. Ask your health care provider what activities are safe for you. Take over-the-counter and prescription medicines only as told by your health care provider. If you smoke, do not smoke without supervision. Keep all follow-up visits as told by your health care provider. This is important. Contact a health care provider if: You have nausea or vomiting that does not get better with medicine. You cannot eat or drink without vomiting. You have pain that does not get better with  medicine. You are unable to pass urine. You develop a skin rash. You have a fever. You have redness around your IV site that gets worse. Get help right away if: You have difficulty breathing. You have chest pain. You have blood in your urine or stool, or you vomit blood. Summary After the procedure, it is common to have a sore throat or nausea. It is also common to feel tired. Have a responsible adult stay with you for the time you are told. It is important to have someone help care for you until you are awake and alert. When you feel hungry, start by eating small amounts of foods that are soft and easy to digest (bland), such as toast. Gradually return to your regular diet. Drink enough fluid to keep your urine pale yellow. Return to your normal activities as told by your health care provider. Ask your health care provider what activities are safe for you. This information is not intended to replace advice given to you by your health care provider. Make sure you discuss any questions you have with your health care provider. Document Revised: 07/05/2020 Document Reviewed: 02/02/2020 Elsevier Patient Education  2023 [employer] How to Use Chlorhexidine for Bathing Chlorhexidine gluconate (CHG) is a germ-killing (antiseptic) solution that is used to clean the skin. It can get rid of the bacteria that normally live on the skin and can keep them away for about 24 hours. To clean your skin with CHG, you may be given: A CHG solution to use in the shower or as part of a sponge bath. A prepackaged cloth that contains CHG. Cleaning your skin with CHG may help lower the risk for infection: While you are staying in the intensive care unit of the hospital. If you have a vascular access, such as a central line, to provide short-term or long-term access to your veins. If you have a catheter to drain urine from your bladder. If you are on a ventilator. A ventilator is a machine that helps you breathe  by moving air in and out of your lungs. After surgery. What are the risks? Risks of using CHG include: A skin reaction. Hearing loss, if CHG gets in your ears and you have a perforated eardrum. Eye injury, if CHG gets in your eyes and is not rinsed out. The CHG product catching fire. Make sure that you avoid smoking and flames after applying CHG to your skin. Do not use CHG: If you have a chlorhexidine allergy or have previously reacted to chlorhexidine. On babies younger than 76 months of age. How to use CHG solution Use CHG only as told by your health care provider, and follow the instructions on the label. Use the full amount of CHG as directed. Usually, this is one bottle. During a shower Follow these steps when using CHG solution during a shower (unless your  health care provider gives you different instructions): Start the shower. Use your normal soap and shampoo to wash your face and hair. Turn off the shower or move out of the shower stream. Pour the CHG onto a clean washcloth. Do not use any type of brush or rough-edged sponge. Starting at your neck, lather your body down to your toes. Make sure you follow these instructions: If you will be having surgery, pay special attention to the part of your body where you will be having surgery. Scrub this area for at least 1 minute. Do not use CHG on your head or face. If the solution gets into your ears or eyes, rinse them well with water. Avoid your genital area. Avoid any areas of skin that have broken skin, cuts, or scrapes. Scrub your back and under your arms. Make sure to wash skin folds. Let the lather sit on your skin for 1-2 minutes or as long as told by your health care provider. Thoroughly rinse your entire body in the shower. Make sure that all body creases and crevices are rinsed well. Dry off with a clean towel. Do not put any substances on your body afterward--such as powder, lotion, or perfume--unless you are told to do so  by your health care provider. Only use lotions that are recommended by the manufacturer. Put on clean clothes or pajamas. If it is the night before your surgery, sleep in clean sheets.  During a sponge bath Follow these steps when using CHG solution during a sponge bath (unless your health care provider gives you different instructions): Use your normal soap and shampoo to wash your face and hair. Pour the CHG onto a clean washcloth. Starting at your neck, lather your body down to your toes. Make sure you follow these instructions: If you will be having surgery, pay special attention to the part of your body where you will be having surgery. Scrub this area for at least 1 minute. Do not use CHG on your head or face. If the solution gets into your ears or eyes, rinse them well with water. Avoid your genital area. Avoid any areas of skin that have broken skin, cuts, or scrapes. Scrub your back and under your arms. Make sure to wash skin folds. Let the lather sit on your skin for 1-2 minutes or as long as told by your health care provider. Using a different clean, wet washcloth, thoroughly rinse your entire body. Make sure that all body creases and crevices are rinsed well. Dry off with a clean towel. Do not put any substances on your body afterward--such as powder, lotion, or perfume--unless you are told to do so by your health care provider. Only use lotions that are recommended by the manufacturer. Put on clean clothes or pajamas. If it is the night before your surgery, sleep in clean sheets. How to use CHG prepackaged cloths Only use CHG cloths as told by your health care provider, and follow the instructions on the label. Use the CHG cloth on clean, dry skin. Do not use the CHG cloth on your head or face unless your health care provider tells you to. When washing with the CHG cloth: Avoid your genital area. Avoid any areas of skin that have broken skin, cuts, or scrapes. Before  surgery Follow these steps when using a CHG cloth to clean before surgery (unless your health care provider gives you different instructions): Using the CHG cloth, vigorously scrub the part of your body where you will be  having surgery. Scrub using a back-and-forth motion for 3 minutes. The area on your body should be completely wet with CHG when you are done scrubbing. Do not rinse. Discard the cloth and let the area air-dry. Do not put any substances on the area afterward, such as powder, lotion, or perfume. Put on clean clothes or pajamas. If it is the night before your surgery, sleep in clean sheets.  For general bathing Follow these steps when using CHG cloths for general bathing (unless your health care provider gives you different instructions). Use a separate CHG cloth for each area of your body. Make sure you wash between any folds of skin and between your fingers and toes. Wash your body in the following order, switching to a new cloth after each step: The front of your neck, shoulders, and chest. Both of your arms, under your arms, and your hands. Your stomach and groin area, avoiding the genitals. Your right leg and foot. Your left leg and foot. The back of your neck, your back, and your buttocks. Do not rinse. Discard the cloth and let the area air-dry. Do not put any substances on your body afterward--such as powder, lotion, or perfume--unless you are told to do so by your health care provider. Only use lotions that are recommended by the manufacturer. Put on clean clothes or pajamas. Contact a health care provider if: Your skin gets irritated after scrubbing. You have questions about using your solution or cloth. You swallow any chlorhexidine. Call your local poison control center [phone] in the [place] Get help right away if: Your eyes itch badly, or they become very red or swollen. Your skin itches badly and is red or swollen. Your hearing changes. You have trouble  seeing. You have swelling or tingling in your mouth or throat. You have trouble breathing. These symptoms may represent a serious problem that is an emergency. Do not wait to see if the symptoms will go away. Get medical help right away. Call your local emergency services (911 in the [place] Do not drive yourself to the hospital. Summary Chlorhexidine gluconate (CHG) is a germ-killing (antiseptic) solution that is used to clean the skin. Cleaning your skin with CHG may help to lower your risk for infection. You may be given CHG to use for bathing. It may be in a bottle or in a prepackaged cloth to use on your skin. Carefully follow your health care provider's instructions and the instructions on the product label. Do not use CHG if you have a chlorhexidine allergy. Contact your health care provider if your skin gets irritated after scrubbing. This information is not intended to replace advice given to you by your health care provider. Make sure you discuss any questions you have with your health care provider. Document Revised: 12/31/2020 Document Reviewed: 12/31/2020 Elsevier Patient Education  2023 [employer].

## 2022-05-21 ENCOUNTER — Encounter (HOSPITAL_COMMUNITY): Payer: Self-pay

## 2022-05-21 ENCOUNTER — Telehealth: Payer: Self-pay | Admitting: Orthopedic Surgery

## 2022-05-21 ENCOUNTER — Encounter (HOSPITAL_COMMUNITY)
Admission: RE | Admit: 2022-05-21 | Discharge: 2022-05-21 | Disposition: A | Discharge status: Home / Self Care | Payer: Managed Care, Other (non HMO) | Source: Ambulatory Visit | Attending: Orthopedic Surgery | Admitting: Orthopedic Surgery

## 2022-05-21 ENCOUNTER — Telehealth: Payer: Self-pay | Admitting: Radiology

## 2022-05-21 ENCOUNTER — Other Ambulatory Visit: Payer: Self-pay

## 2022-05-21 VITALS — BP 121/69 | HR 67 | Temp 97.8°F | Resp 18 | Ht 71.0 in | Wt 156.1 lb

## 2022-05-21 DIAGNOSIS — X58XXXD Exposure to other specified factors, subsequent encounter: Secondary | ICD-10-CM | POA: Insufficient documentation

## 2022-05-21 DIAGNOSIS — Z01818 Encounter for other preprocedural examination: Secondary | ICD-10-CM | POA: Diagnosis present

## 2022-05-21 DIAGNOSIS — I10 Essential (primary) hypertension: Secondary | ICD-10-CM | POA: Insufficient documentation

## 2022-05-21 DIAGNOSIS — S83206D Unspecified tear of unspecified meniscus, current injury, right knee, subsequent encounter: Secondary | ICD-10-CM | POA: Insufficient documentation

## 2022-05-21 DIAGNOSIS — S83241D Other tear of medial meniscus, current injury, right knee, subsequent encounter: Secondary | ICD-10-CM

## 2022-05-21 LAB — BASIC METABOLIC PANEL
Anion gap: 7 (ref 5–15)
BUN: 15 mg/dL (ref 6–20)
CO2: 26 mmol/L (ref 22–32)
Calcium: 8.7 mg/dL — ABNORMAL LOW (ref 8.9–10.3)
Chloride: 106 mmol/L (ref 98–111)
Creatinine, Ser: 1.09 mg/dL (ref 0.61–1.24)
GFR, Estimated: >60 mL/min (ref >60)
Glucose, Bld: 86 mg/dL (ref 70–99)
Potassium: 4.2 mmol/L (ref 3.5–5.1)
Sodium: 139 mmol/L (ref 135–145)

## 2022-05-21 LAB — CBC WITH DIFFERENTIAL/PLATELET
Abs Immature Granulocytes: 0.02 10*3/uL (ref 0.00–0.07)
Basophils Absolute: 0.1 10*3/uL (ref 0.0–0.1)
Basophils Relative: 1 %
Eosinophils Absolute: 0.2 10*3/uL (ref 0.0–0.5)
Eosinophils Relative: 2 %
HCT: 40.7 % (ref 39.0–52.0)
Hemoglobin: 13.9 g/dL (ref 13.0–17.0)
Immature Granulocytes: 0 %
Lymphocytes Relative: 15 %
Lymphs Abs: 1.3 10*3/uL (ref 0.7–4.0)
MCH: 31.8 pg (ref 26.0–34.0)
MCHC: 34.2 g/dL (ref 30.0–36.0)
MCV: 93.1 fL (ref 80.0–100.0)
Monocytes Absolute: 0.8 10*3/uL (ref 0.1–1.0)
Monocytes Relative: 9 %
Neutro Abs: 6.4 10*3/uL (ref 1.7–7.7)
Neutrophils Relative %: 73 %
Platelets: 239 10*3/uL (ref 150–400)
RBC: 4.37 MIL/uL (ref 4.22–5.81)
RDW: 12.9 % (ref 11.5–15.5)
WBC: 8.8 10*3/uL (ref 4.0–10.5)
nRBC: 0 % (ref 0.0–0.2)

## 2022-05-21 NOTE — Telephone Encounter (Signed)
Patient came to office following his pre-op appointment for surgery scheduled for Friday, 05/23/22 for knee arthroscopy - states then went home to check mail, and received a letter from [employer] - which we have copied and placed in box - dated 05/16/22, indicating that surgery has been denied by [place].   - called our administrator Toniann Fail, and to clinic staff who are out of our office this afternoon.     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     ---please advise.

## 2022-05-21 NOTE — Telephone Encounter (Signed)
-----   Message from Caffie Damme, RT sent at 05/21/2022 11:42 AM EDT ----- Regarding: RE: Call back from [facility] It was approved information is in the chart  ----- Message ----- From: Doristine Section Sent: 05/21/2022  11:10 AM EDT To: Caffie Damme, RT Subject: Call back from [facility]         RE: KAYVAN, HOEFLING [[id number]] - call rec'd from [place] at [facility] stating that pt's surgery authorization has been denied in to surgery scheduled 05/23/22. Her phone # is [phone] ext [phone]

## 2022-05-21 NOTE — Telephone Encounter (Signed)
-----   Message from Doristine Section sent at 05/21/2022 11:07 AM EDT ----- Regarding: Call back from [facility] RE: MAMORU, TAKESHITA [[id number]] - call rec'd from [place] at [facility] stating that pt's surgery authorization has been denied in to surgery scheduled 05/23/22. Her phone # is [phone] ext [phone]

## 2022-05-22 NOTE — H&P (Signed)
Richard Matthews is an 56 y.o. male.    Outpatient surgery admission history and physical   Chief Complaint: Painful right knee   HPI: Richard Matthews is 56 years old he presented to [place] June of this year he had had a previous ACL reconstruction in 2008 with an allograft he had some meniscal work done on his knee as well at that time he is not sure if it was repaired or removed and he also had an MCL injury with healed primarily without repair  He was doing well up until he built a ramp in his backyard and since that time he has had worsening medial knee pain decreased range of motion partially relieved by ibuprofen  He did not complain of instability however after Aspiration and injection he still had pain and we eventually obtained an MRI  His MRI showed he had a medial meniscal tear  IMPRESSION: 1. Status post ACL reconstruction.  The graft fibers appear intact. 2. Oblique undersurface tear diffusely involving the posterior horn of the medial meniscus. This is age indeterminate. No comparison MRI is available. 3. Moderate joint effusion. 4. Mild edema and swelling within the subcutaneous fat just anterior to the proximal patellar tendon but normal appearance of the patellar tendon.     Electronically Signed   By: Neita Garnet M.D.   On: 04/23/2022 17:37  Because it was not getting any better and he wanted to return to his preinjury state of health he decided that he wanted to proceed with surgical intervention so we advised that he should have an arthroscopy of the right knee to evaluate the joint and then probably partial medial meniscectomy  Past Medical History:  Diagnosis Date   Hypertension     Past Surgical History:  Procedure Laterality Date   KNEE ARTHROSCOPY W/ ACL RECONSTRUCTION Right     Family History  Problem Relation Age of Onset   AAA (abdominal aortic aneurysm) Mother    Cancer Father    Social History:  reports that he has never smoked. He has never used  smokeless tobacco. He reports current alcohol use of about 2.0 standard drinks of alcohol per week. He reports that he does not currently use drugs.  Allergies:  Allergies  Allergen Reactions   Levofloxacin Hives   Eggs Or Egg-Derived Products Hives    No medications prior to admission.    Results for orders placed or performed during the hospital encounter of 05/21/22 (from the past 48 hour(s))  CBC WITH DIFFERENTIAL     Status: None   Collection Time: 05/21/22  3:30 PM  Result Value Ref Range   WBC 8.8 4.0 - 10.5 K/uL   RBC 4.37 4.22 - 5.81 MIL/uL   Hemoglobin 13.9 13.0 - 17.0 g/dL   HCT 19.1 47.8 - 29.5 %   MCV 93.1 80.0 - 100.0 fL   MCH 31.8 26.0 - 34.0 pg   MCHC 34.2 30.0 - 36.0 g/dL   RDW 62.1 30.8 - 65.7 %   Platelets 239 150 - 400 K/uL   nRBC 0.0 0.0 - 0.2 %   Neutrophils Relative % 73 %   Neutro Abs 6.4 1.7 - 7.7 K/uL   Lymphocytes Relative 15 %   Lymphs Abs 1.3 0.7 - 4.0 K/uL   Monocytes Relative 9 %   Monocytes Absolute 0.8 0.1 - 1.0 K/uL   Eosinophils Relative 2 %   Eosinophils Absolute 0.2 0.0 - 0.5 K/uL   Basophils Relative 1 %   Basophils Absolute  0.1 0.0 - 0.1 K/uL   Immature Granulocytes 0 %   Abs Immature Granulocytes 0.02 0.00 - 0.07 K/uL    Comment: Performed at [facility], [address] [place], [place] [address]  Basic metabolic panel     Status: Abnormal   Collection Time: 05/21/22  3:30 PM  Result Value Ref Range   Sodium 139 135 - 145 mmol/L   Potassium 4.2 3.5 - 5.1 mmol/L   Chloride 106 98 - 111 mmol/L   CO2 26 22 - 32 mmol/L   Glucose, Bld 86 70 - 99 mg/dL    Comment: Glucose reference range applies only to samples taken after fasting for at least 8 hours.   BUN 15 6 - 20 mg/dL   Creatinine, Ser 0.86 0.61 - 1.24 mg/dL   Calcium 8.7 (L) 8.9 - 10.3 mg/dL   GFR, Estimated >57 >84 mL/min    Comment: (NOTE) Calculated using the CKD-EPI Creatinine Equation (2021)    Anion gap 7 5 - 15    Comment: Performed at [facility], [address], [place], [place] [address]   No results found.  Review of Systems  Negative system review, no chest pain shortness of breath no history of back problems fever chills skin rash.  There were no vitals taken for this visit. Physical Exam   Constitutional: Vital signs in the office were stable General appearance: Normal development grooming and hygiene athletic build  Cardiovascular: No evidence of swelling or varicose veins pulses palpated normal with normal temperature without edema or tenderness   Skin normal in all 4 extremities  Neuro: Coordination was normal good deep tendon reflexes equal normal sensation  Psych alert and oriented x3, no evidence of depression anxiety agitation  Musculoskeletal gait slight limp  Right knee The Lachman test seems normal.  He did have some tightness when he flexes his knee prior to the aspiration.  He had some tenderness over the medial joint line I thought his McMurray's sign was equivocal.  His collateral ligaments were stable.  Muscle tone was normal.  Skin incision normal no erythema      Assessment/Plan The patient had an ACL reconstruction with some type of meniscal work were not sure if it was meniscectomy or repair he also had medial collateral ligament injury which healed by itself he was in his usual state of health he started having pain swelling he did have the aspiration and injection he did get better even with ibuprofen.  His MRI shows a medial meniscal tear he still limping  He opted for arthroscopy to evaluate the joint and address the meniscus with meniscectomy if needed  The procedure has been fully reviewed with the patient; The risks and benefits of surgery have been discussed and explained and understood. Alternative treatment has also been reviewed, questions were encouraged and answered. The postoperative plan is also been reviewed.   Fuller Canada, MD 05/22/2022, 5:40 PM

## 2022-05-22 NOTE — Telephone Encounter (Signed)
Surgery approved, Dr Romeo Apple spoke to Doctor at [employer] Everything is approved, reassurance provided, all this is documented in chart I discussed with patient

## 2022-05-23 ENCOUNTER — Ambulatory Visit (HOSPITAL_COMMUNITY)
Admission: RE | Admit: 2022-05-23 | Discharge: 2022-05-23 | Disposition: A | Discharge status: Home / Self Care | Payer: Managed Care, Other (non HMO) | Attending: Orthopedic Surgery | Admitting: Orthopedic Surgery

## 2022-05-23 ENCOUNTER — Encounter (HOSPITAL_COMMUNITY): Payer: Self-pay | Admitting: Orthopedic Surgery

## 2022-05-23 ENCOUNTER — Encounter (HOSPITAL_COMMUNITY)
Admission: RE | Disposition: A | Discharge status: Home / Self Care | Payer: Self-pay | Source: Home / Self Care | Attending: Orthopedic Surgery

## 2022-05-23 ENCOUNTER — Ambulatory Visit (HOSPITAL_COMMUNITY): Payer: Managed Care, Other (non HMO) | Admitting: Anesthesiology

## 2022-05-23 ENCOUNTER — Ambulatory Visit (HOSPITAL_BASED_OUTPATIENT_CLINIC_OR_DEPARTMENT_OTHER): Payer: Managed Care, Other (non HMO) | Admitting: Anesthesiology

## 2022-05-23 ENCOUNTER — Other Ambulatory Visit: Payer: Self-pay

## 2022-05-23 DIAGNOSIS — X58XXXA Exposure to other specified factors, initial encounter: Secondary | ICD-10-CM | POA: Insufficient documentation

## 2022-05-23 DIAGNOSIS — S83241A Other tear of medial meniscus, current injury, right knee, initial encounter: Secondary | ICD-10-CM

## 2022-05-23 DIAGNOSIS — I10 Essential (primary) hypertension: Secondary | ICD-10-CM | POA: Diagnosis not present

## 2022-05-23 DIAGNOSIS — M2540 Effusion, unspecified joint: Secondary | ICD-10-CM | POA: Diagnosis not present

## 2022-05-23 DIAGNOSIS — Z9889 Other specified postprocedural states: Secondary | ICD-10-CM | POA: Insufficient documentation

## 2022-05-23 DIAGNOSIS — S83206A Unspecified tear of unspecified meniscus, current injury, right knee, initial encounter: Secondary | ICD-10-CM

## 2022-05-23 DIAGNOSIS — S83211A Bucket-handle tear of medial meniscus, current injury, right knee, initial encounter: Secondary | ICD-10-CM

## 2022-05-23 HISTORY — PX: KNEE ARTHROSCOPY WITH MEDIAL MENISECTOMY: SHX5651

## 2022-05-23 SURGERY — ARTHROSCOPY, KNEE, WITH MEDIAL MENISCECTOMY
Anesthesia: General | Site: Knee | Laterality: Right

## 2022-05-23 MED ORDER — GLYCOPYRROLATE 0.2 MG/ML IJ SOLN
INTRAMUSCULAR | Status: DC | PRN
  Administered 2022-05-23: .1 mg via INTRAVENOUS

## 2022-05-23 MED ORDER — ORAL CARE MOUTH RINSE: 15.0000 mL | Freq: Once | OROMUCOSAL | Status: AC

## 2022-05-23 MED ORDER — PROPOFOL 10 MG/ML IV BOLUS
INTRAVENOUS | Status: DC | PRN
  Administered 2022-05-23: 170 mg via INTRAVENOUS

## 2022-05-23 MED ORDER — ACETAMINOPHEN 10 MG/ML IV SOLN
INTRAVENOUS | Status: DC | PRN
  Administered 2022-05-23: 1000 mg via INTRAVENOUS

## 2022-05-23 MED ORDER — HYDROCODONE-ACETAMINOPHEN 5-325 MG PO TABS
ORAL_TABLET | ORAL | Status: AC
  Filled 2022-05-23: qty 1

## 2022-05-23 MED ORDER — MEPERIDINE HCL 50 MG/ML IJ SOLN: 6.2500 mg | INTRAMUSCULAR | Status: DC | PRN

## 2022-05-23 MED ORDER — DEXAMETHASONE SODIUM PHOSPHATE 10 MG/ML IJ SOLN
INTRAMUSCULAR | Status: AC
  Filled 2022-05-23: qty 1

## 2022-05-23 MED ORDER — KETOROLAC TROMETHAMINE 30 MG/ML IJ SOLN
INTRAMUSCULAR | Status: AC
  Filled 2022-05-23: qty 1

## 2022-05-23 MED ORDER — PROPOFOL 10 MG/ML IV BOLUS
INTRAVENOUS | Status: AC
  Filled 2022-05-23: qty 20

## 2022-05-23 MED ORDER — HYDROCODONE-ACETAMINOPHEN 5-325 MG PO TABS
1.0000 | ORAL_TABLET | ORAL | 0 refills | Status: AC | PRN
Start: 2022-05-23 — End: 2022-05-28

## 2022-05-23 MED ORDER — ACETAMINOPHEN 10 MG/ML IV SOLN
INTRAVENOUS | Status: AC
  Filled 2022-05-23: qty 100

## 2022-05-23 MED ORDER — BUPIVACAINE-EPINEPHRINE (PF) 0.5% -1:200000 IJ SOLN
INTRAMUSCULAR | Status: AC
  Filled 2022-05-23: qty 60

## 2022-05-23 MED ORDER — MIDAZOLAM HCL 2 MG/2ML IJ SOLN
INTRAMUSCULAR | Status: AC
  Filled 2022-05-23: qty 2

## 2022-05-23 MED ORDER — KETOROLAC TROMETHAMINE 30 MG/ML IJ SOLN
INTRAMUSCULAR | Status: DC | PRN
  Administered 2022-05-23: 30 mg via INTRAVENOUS

## 2022-05-23 MED ORDER — FENTANYL CITRATE (PF) 100 MCG/2ML IJ SOLN
INTRAMUSCULAR | Status: DC | PRN
  Administered 2022-05-23: 50 ug via INTRAVENOUS
  Administered 2022-05-23: 25 ug via INTRAVENOUS
  Administered 2022-05-23: 50 ug via INTRAVENOUS
  Administered 2022-05-23: 25 ug via INTRAVENOUS

## 2022-05-23 MED ORDER — DEXAMETHASONE SODIUM PHOSPHATE 10 MG/ML IJ SOLN: 10.0000 mg | Freq: Once | INTRAMUSCULAR | Status: DC

## 2022-05-23 MED ORDER — BUPIVACAINE-EPINEPHRINE (PF) 0.5% -1:200000 IJ SOLN
INTRAMUSCULAR | Status: DC | PRN
  Administered 2022-05-23: 30 mL

## 2022-05-23 MED ORDER — CHLORHEXIDINE GLUCONATE 0.12 % MT SOLN
15.0000 mL | Freq: Once | OROMUCOSAL | Status: AC
  Administered 2022-05-23: 15 mL via OROMUCOSAL

## 2022-05-23 MED ORDER — LIDOCAINE 2% (20 MG/ML) 5 ML SYRINGE
INTRAMUSCULAR | Status: DC | PRN
  Administered 2022-05-23: 100 mg via INTRAVENOUS

## 2022-05-23 MED ORDER — DEXAMETHASONE SODIUM PHOSPHATE 4 MG/ML IJ SOLN
INTRAMUSCULAR | Status: AC
  Filled 2022-05-23: qty 3

## 2022-05-23 MED ORDER — LIDOCAINE HCL (PF) 2 % IJ SOLN
INTRAMUSCULAR | Status: AC
  Filled 2022-05-23: qty 5

## 2022-05-23 MED ORDER — FENTANYL CITRATE (PF) 250 MCG/5ML IJ SOLN
INTRAMUSCULAR | Status: AC
  Filled 2022-05-23: qty 5

## 2022-05-23 MED ORDER — DEXAMETHASONE SODIUM PHOSPHATE 4 MG/ML IJ SOLN
10.0000 mg | Freq: Once | INTRAMUSCULAR | Status: AC
  Administered 2022-05-23: 10 mg via INTRAVENOUS

## 2022-05-23 MED ORDER — HYDROMORPHONE HCL 1 MG/ML IJ SOLN
0.2500 mg | INTRAMUSCULAR | Status: DC | PRN
  Administered 2022-05-23: 0.5 mg via INTRAVENOUS
  Filled 2022-05-23: qty 0.5

## 2022-05-23 MED ORDER — ONDANSETRON HCL 4 MG/2ML IJ SOLN
4.0000 mg | Freq: Once | INTRAMUSCULAR | Status: DC | PRN
Start: 2022-05-23 — End: 2022-05-23

## 2022-05-23 MED ORDER — ONDANSETRON HCL 4 MG/2ML IJ SOLN
INTRAMUSCULAR | Status: AC
  Filled 2022-05-23: qty 2

## 2022-05-23 MED ORDER — SODIUM CHLORIDE 0.9 % IR SOLN
Status: DC | PRN
  Administered 2022-05-23 (×3): 3000 mL

## 2022-05-23 MED ORDER — HYDROCODONE-ACETAMINOPHEN 5-325 MG PO TABS
1.0000 | ORAL_TABLET | Freq: Once | ORAL | Status: AC
  Administered 2022-05-23: 1 via ORAL

## 2022-05-23 MED ORDER — LACTATED RINGERS IV SOLN
INTRAVENOUS | Status: DC
Start: 2022-05-23 — End: 2022-05-23

## 2022-05-23 MED ORDER — IBUPROFEN 800 MG PO TABS: 800.0000 mg | ORAL_TABLET | Freq: Three times a day (TID) | ORAL | 1 refills | Status: DC | PRN

## 2022-05-23 MED ORDER — ONDANSETRON HCL 4 MG/2ML IJ SOLN
INTRAMUSCULAR | Status: DC | PRN
  Administered 2022-05-23: 4 mg via INTRAVENOUS

## 2022-05-23 MED ORDER — EPINEPHRINE PF 1 MG/ML IJ SOLN
INTRAMUSCULAR | Status: AC
  Filled 2022-05-23: qty 4

## 2022-05-23 MED ORDER — CEFAZOLIN SODIUM-DEXTROSE 2-4 GM/100ML-% IV SOLN
2.0000 g | INTRAVENOUS | Status: AC
  Administered 2022-05-23: 2 g via INTRAVENOUS

## 2022-05-23 MED ORDER — CEFAZOLIN SODIUM-DEXTROSE 2-4 GM/100ML-% IV SOLN
INTRAVENOUS | Status: AC
  Filled 2022-05-23: qty 100

## 2022-05-23 MED ORDER — DEXAMETHASONE SODIUM PHOSPHATE 10 MG/ML IJ SOLN
INTRAMUSCULAR | Status: DC | PRN
  Administered 2022-05-23: 5 mg via INTRAVENOUS

## 2022-05-23 MED ORDER — MIDAZOLAM HCL 5 MG/5ML IJ SOLN
INTRAMUSCULAR | Status: DC | PRN
  Administered 2022-05-23: 2 mg via INTRAVENOUS

## 2022-05-23 SURGICAL SUPPLY — 46 items
APL PRP STRL LF DISP 70% ISPRP (MISCELLANEOUS) ×1
BAG HAMPER (MISCELLANEOUS) ×2 IMPLANT
BLADE SHAVER TORPEDO 4X13 (MISCELLANEOUS) ×1 IMPLANT
BLADE SURG SZ11 CARB STEEL (BLADE) ×2 IMPLANT
BNDG CMPR STD VLCR NS LF 5.8X6 (GAUZE/BANDAGES/DRESSINGS) ×1
BNDG ELASTIC 6X5.8 VLCR NS LF (GAUZE/BANDAGES/DRESSINGS) ×2 IMPLANT
CHLORAPREP W/TINT 26 (MISCELLANEOUS) ×2 IMPLANT
CLOTH BEACON ORANGE TIMEOUT ST (SAFETY) ×2 IMPLANT
COOLER ICEMAN CLASSIC (MISCELLANEOUS) ×2 IMPLANT
DRAPE HALF SHEET 40X57 (DRAPES) ×2 IMPLANT
GAUZE SPONGE 4X4 12PLY STRL (GAUZE/BANDAGES/DRESSINGS) ×2 IMPLANT
GAUZE XEROFORM 5X9 LF (GAUZE/BANDAGES/DRESSINGS) ×2 IMPLANT
GLOVE BIOGEL PI IND STRL 6.5 (GLOVE) IMPLANT
GLOVE BIOGEL PI IND STRL 7.0 (GLOVE) ×2 IMPLANT
GLOVE BIOGEL PI IND STRL 8.5 (GLOVE) IMPLANT
GLOVE BIOGEL PI INDICATOR 6.5 (GLOVE) ×1
GLOVE BIOGEL PI INDICATOR 7.0 (GLOVE) ×2
GLOVE BIOGEL PI INDICATOR 8.5 (GLOVE) ×1
GLOVE SKINSENSE NS SZ8.0 LF (GLOVE) ×1
GLOVE SKINSENSE STRL SZ8.0 LF (GLOVE) IMPLANT
GLOVE SURG SS PI 6.5 STRL IVOR (GLOVE) ×1 IMPLANT
GOWN STRL REUS W/TWL LRG LVL3 (GOWN DISPOSABLE) ×2 IMPLANT
GOWN STRL REUS W/TWL XL LVL3 (GOWN DISPOSABLE) ×2 IMPLANT
IV NS IRRIG 3000ML ARTHROMATIC (IV SOLUTION) ×5 IMPLANT
KIT BLADEGUARD II DBL (SET/KITS/TRAYS/PACK) ×2 IMPLANT
KIT TURNOVER CYSTO (KITS) ×2 IMPLANT
MANIFOLD NEPTUNE II (INSTRUMENTS) ×2 IMPLANT
MARKER SKIN DUAL TIP RULER LAB (MISCELLANEOUS) ×2 IMPLANT
NDL HYPO 18GX1.5 BLUNT FILL (NEEDLE) ×1 IMPLANT
NDL HYPO 21X1.5 SAFETY (NEEDLE) ×1 IMPLANT
NEEDLE HYPO 18GX1.5 BLUNT FILL (NEEDLE) ×2 IMPLANT
NEEDLE HYPO 21X1.5 SAFETY (NEEDLE) ×2 IMPLANT
PACK ARTHRO LIMB DRAPE STRL (MISCELLANEOUS) ×2 IMPLANT
PACK ARTHROSCOPY WL (CUSTOM PROCEDURE TRAY) ×2 IMPLANT
PAD ABD 5X9 TENDERSORB (GAUZE/BANDAGES/DRESSINGS) ×2 IMPLANT
PAD ARMBOARD 7.5X6 YLW CONV (MISCELLANEOUS) ×2 IMPLANT
PAD COLD SHLDR SM WRAP-ON (PAD) ×1 IMPLANT
PAD FOR LEG HOLDER (MISCELLANEOUS) ×2 IMPLANT
PADDING CAST COTTON 6X4 STRL (CAST SUPPLIES) ×2 IMPLANT
SET ARTHROSCOPY INST (INSTRUMENTS) ×2 IMPLANT
SET BASIN LINEN APH (SET/KITS/TRAYS/PACK) ×2 IMPLANT
SUT ETHILON 3 0 FSL (SUTURE) ×2 IMPLANT
SYR 10ML LL (SYRINGE) ×2 IMPLANT
SYR 30ML LL (SYRINGE) ×3 IMPLANT
TUBE CONNECTING 12X1/4 (SUCTIONS) ×3 IMPLANT
TUBING IN/OUT FLOW W/MAIN PUMP (TUBING) ×2 IMPLANT

## 2022-05-23 NOTE — Op Note (Signed)
05/23/2022  8:46 AM  PATIENT:  Richard Matthews  56 y.o. male  PRE-OPERATIVE DIAGNOSIS:  torn medial meniscus right knee  POST-OPERATIVE DIAGNOSIS:  torn medial meniscus right knee  PROCEDURE:  Procedure(s): KNEE ARTHROSCOPY WITH MEDIAL MENISECTOMY (Right)  Surgical findings  ACL graft was intact pristine revascularized in excellent position  The cartilaginous surfaces of the medial lateral compartment and patellofemoral joint normal pristine  Torn medial meniscus posterior horn near the root extending into the posterior leaf of the meniscus.  Extensive synovitis extensive injected synovial tissue  Details of procedure  The patient was taken to the operating room for general anesthesia.  I did a brief exam under anesthesia his ACL graft Lachman test was normal no pivot shift.  Collateral ligament stable.  His passive range of motion test flexion was 115 degrees and he came to full extension he did have effusion  After sterile prep and drape timeout was completed site of surgery and procedure confirmed patient identity confirmed  I made a standard lateral portal and placed the scope in the joint I did a diagnostic arthroscopy.  I listed the findings above.  I made a medial portal with the assistance of a spinal needle.  I placed the probe into the joint and probed the meniscus.  The tear was at the free edge of the root.  It extended into the posterior horn of the meniscus towards the body.  The tear was debrided with a straight arthroscopic biter and the fragments were removed with a straight motorized shaver using the torpedo blade.  The meniscus was further contoured with a torpedo blade and then probed to confirm a stable rim.  The joint had extensive synovitis.  There was scar tissue in the joint from the synovial irritation which I removed with a shaver.  I did not do an extensive synovectomy to avoid causing any excessive bleeding.  I irrigated the joint cleaned out any  small debris and then closed the portals with 3-0 nylon sutures.  I injected Marcaine with epinephrine  The patient was extubated and taken to recovery room in stable condition  I will give him a dose of steroids in the PACU to account for the synovitis and then put him on anti-inflammatories postop for period of 6 weeks  He is weightbearing as tolerated  SURGEON:  Surgeon(s) and Role:    * Imo Cumbie E, MD - Primary  PHYSICIAN ASSISTANT:   ASSISTANTS: none   ANESTHESIA:   general  EBL:  10 mL   BLOOD ADMINISTERED:none  DRAINS: none   LOCAL MEDICATIONS USED:  MARCAINE     SPECIMEN:  No Specimen  DISPOSITION OF SPECIMEN:  N/A  COUNTS:  YES  TOURNIQUET:  * No tourniquets in log *  DICTATION: .Dragon Dictation  PLAN OF CARE: Discharge to home after PACU  PATIENT DISPOSITION:  PACU - hemodynamically stable.   Delay start of Pharmacological VTE agent (>24hrs) due to surgical blood loss or risk of bleeding: not applicable  

## 2022-05-23 NOTE — Brief Op Note (Signed)
05/23/2022  8:40 AM  PATIENT:  Richard Matthews  56 y.o. male  PRE-OPERATIVE DIAGNOSIS:  torn medial meniscus right knee  POST-OPERATIVE DIAGNOSIS:  torn medial meniscus right knee  PROCEDURE:  Procedure(s): KNEE ARTHROSCOPY WITH MEDIAL MENISECTOMY (Right)  Surgical findings  ACL graft was intact pristine revascularized in excellent position  The cartilaginous surfaces of the medial lateral compartment and patellofemoral joint normal pristine  Torn medial meniscus posterior horn near the root extending into the posterior leaf of the meniscus.  Extensive synovitis extensive injected synovial tissue  Details of procedure  The patient was taken to the operating room for general anesthesia.  I did a brief exam under anesthesia his ACL graft Lachman test was normal no pivot shift.  Collateral ligament stable.  His passive range of motion test flexion was 115 degrees and he came to full extension he did have effusion  After sterile prep and drape timeout was completed site of surgery and procedure confirmed patient identity confirmed  I made a standard lateral portal and placed the scope in the joint I did a diagnostic arthroscopy.  I listed the findings above.  I made a medial portal with the assistance of a spinal needle.  I placed the probe into the joint and probed the meniscus.  The tear was at the free edge of the root.  It extended into the posterior horn of the meniscus towards the body.  The tear was debrided with a straight arthroscopic biter and the fragments were removed with a straight motorized shaver using the torpedo blade.  The meniscus was further contoured with a torpedo blade and then probed to confirm a stable rim.  The joint had extensive synovitis.  There was scar tissue in the joint from the synovial irritation which I removed with a shaver.  I did not do an extensive synovectomy to avoid causing any excessive bleeding.  I irrigated the joint cleaned out any  small debris and then closed the portals with 3-0 nylon sutures.  I injected Marcaine with epinephrine  The patient was extubated and taken to recovery room in stable condition  I will give him a dose of steroids in the PACU to account for the synovitis and then put him on anti-inflammatories postop for period of 6 weeks  He is weightbearing as tolerated  SURGEON:  Surgeon(s) and Role:    * [name], MD - Primary  PHYSICIAN ASSISTANT:   ASSISTANTS: none   ANESTHESIA:   general  EBL:  5 mL   BLOOD ADMINISTERED:none  DRAINS: none   LOCAL MEDICATIONS USED:  MARCAINE     SPECIMEN:  No Specimen  DISPOSITION OF SPECIMEN:  N/A  COUNTS:  YES  TOURNIQUET:  * No tourniquets in log *  DICTATION: .Dragon Dictation  PLAN OF CARE: Discharge to home after PACU  PATIENT DISPOSITION:  PACU - hemodynamically stable.   Delay start of Pharmacological VTE agent (>24hrs) due to surgical blood loss or risk of bleeding: not applicable

## 2022-05-23 NOTE — Transfer of Care (Signed)
Immediate Anesthesia Transfer of Care Note  Patient: Richard Matthews  Procedure(s) Performed: KNEE ARTHROSCOPY WITH MEDIAL MENISECTOMY (Right: Knee)  Patient Location: PACU  Anesthesia Type:General  Level of Consciousness: drowsy and patient cooperative  Airway & Oxygen Therapy: Patient Spontanous Breathing  Post-op Assessment: Report given to RN and Post -op Vital signs reviewed and stable  Post vital signs: Reviewed and stable  Last Vitals:  Vitals Value Taken Time  BP 128/80 05/23/22 0846  Temp    Pulse 76 05/23/22 0847  Resp 12 05/23/22 0847  SpO2 99 % 05/23/22 0847  Vitals shown include unvalidated device data.  Last Pain:  Vitals:   05/23/22 0630  TempSrc: Oral  PainSc: 4          Complications: No notable events documented.

## 2022-05-23 NOTE — Interval H&P Note (Signed)
History and Physical Interval Note:  05/23/2022 7:27 AM  Richard Matthews  has presented today for surgery, with the diagnosis of torn medial meniscus right knee.  The various methods of treatment have been discussed with the patient and family. After consideration of risks, benefits and other options for treatment, the patient has consented to  Procedure(s): KNEE ARTHROSCOPY WITH MEDIAL MENISECTOMY (Right) as a surgical intervention.  The patient's history has been reviewed, patient examined, no change in status, stable for surgery.  I have reviewed the patient's chart and labs.  Questions were answered to the patient's satisfaction.     Fuller Canada

## 2022-05-23 NOTE — Anesthesia Procedure Notes (Signed)
Procedure Name: LMA Insertion Date/Time: 05/23/2022 7:42 AM  Performed by: Marny Lowenstein, CRNAPre-anesthesia Checklist: Patient identified, Emergency Drugs available, Suction available and Patient being monitored Patient Re-evaluated:Patient Re-evaluated prior to induction Oxygen Delivery Method: Circle system utilized Preoxygenation: Pre-oxygenation with 100% oxygen Induction Type: IV induction Ventilation: Mask ventilation without difficulty LMA: LMA inserted LMA Size: 4.0 Number of attempts: 1 Placement Confirmation: positive ETCO2 and breath sounds checked- equal and bilateral Tube secured with: Tape Dental Injury: Teeth and Oropharynx as per pre-operative assessment

## 2022-05-23 NOTE — Anesthesia Preprocedure Evaluation (Addendum)
Anesthesia Evaluation  Patient identified by MRN, date of birth, ID band Patient awake    Reviewed: Allergy & Precautions, NPO status , Patient's Chart, lab work & pertinent test results  Airway Mallampati: I  TM Distance: >3 FB Neck ROM: Full    Dental  (+) Dental Advisory Given, Teeth Intact   Pulmonary neg pulmonary ROS,    Pulmonary exam normal breath sounds clear to auscultation       Cardiovascular Exercise Tolerance: Good hypertension, Pt. on medications Normal cardiovascular exam Rhythm:Regular Rate:Normal     Neuro/Psych negative neurological ROS  negative psych ROS   GI/Hepatic negative GI ROS, Neg liver ROS,   Endo/Other  negative endocrine ROS  Renal/GU negative Renal ROS  negative genitourinary   Musculoskeletal negative musculoskeletal ROS (+)   Abdominal   Peds negative pediatric ROS (+)  Hematology negative hematology ROS (+)   Anesthesia Other Findings   Reproductive/Obstetrics negative OB ROS                            Anesthesia Physical Anesthesia Plan  ASA: 2  Anesthesia Plan: General   Post-op Pain Management: Dilaudid IV   Induction: Intravenous  PONV Risk Score and Plan: 3 and Ondansetron and Dexamethasone  Airway Management Planned: LMA  Additional Equipment:   Intra-op Plan:   Post-operative Plan: Extubation in OR  Informed Consent: I have reviewed the patients History and Physical, chart, labs and discussed the procedure including the risks, benefits and alternatives for the proposed anesthesia with the patient or authorized representative who has indicated his/her understanding and acceptance.       Plan Discussed with: CRNA and Surgeon  Anesthesia Plan Comments:         Anesthesia Quick Evaluation

## 2022-05-23 NOTE — Brief Op Note (Signed)
05/23/2022  8:46 AM  PATIENT:  Richard Matthews  56 y.o. male  PRE-OPERATIVE DIAGNOSIS:  torn medial meniscus right knee  POST-OPERATIVE DIAGNOSIS:  torn medial meniscus right knee  PROCEDURE:  Procedure(s): KNEE ARTHROSCOPY WITH MEDIAL MENISECTOMY (Right)  Surgical findings  ACL graft was intact pristine revascularized in excellent position  The cartilaginous surfaces of the medial lateral compartment and patellofemoral joint normal pristine  Torn medial meniscus posterior horn near the root extending into the posterior leaf of the meniscus.  Extensive synovitis extensive injected synovial tissue  Details of procedure  The patient was taken to the operating room for general anesthesia.  I did a brief exam under anesthesia his ACL graft Lachman test was normal no pivot shift.  Collateral ligament stable.  His passive range of motion test flexion was 115 degrees and he came to full extension he did have effusion  After sterile prep and drape timeout was completed site of surgery and procedure confirmed patient identity confirmed  I made a standard lateral portal and placed the scope in the joint I did a diagnostic arthroscopy.  I listed the findings above.  I made a medial portal with the assistance of a spinal needle.  I placed the probe into the joint and probed the meniscus.  The tear was at the free edge of the root.  It extended into the posterior horn of the meniscus towards the body.  The tear was debrided with a straight arthroscopic biter and the fragments were removed with a straight motorized shaver using the torpedo blade.  The meniscus was further contoured with a torpedo blade and then probed to confirm a stable rim.  The joint had extensive synovitis.  There was scar tissue in the joint from the synovial irritation which I removed with a shaver.  I did not do an extensive synovectomy to avoid causing any excessive bleeding.  I irrigated the joint cleaned out any  small debris and then closed the portals with 3-0 nylon sutures.  I injected Marcaine with epinephrine  The patient was extubated and taken to recovery room in stable condition  I will give him a dose of steroids in the PACU to account for the synovitis and then put him on anti-inflammatories postop for period of 6 weeks  He is weightbearing as tolerated  SURGEON:  Surgeon(s) and Role:    * [name], MD - Primary  PHYSICIAN ASSISTANT:   ASSISTANTS: none   ANESTHESIA:   general  EBL:  10 mL   BLOOD ADMINISTERED:none  DRAINS: none   LOCAL MEDICATIONS USED:  MARCAINE     SPECIMEN:  No Specimen  DISPOSITION OF SPECIMEN:  N/A  COUNTS:  YES  TOURNIQUET:  * No tourniquets in log *  DICTATION: .Dragon Dictation  PLAN OF CARE: Discharge to home after PACU  PATIENT DISPOSITION:  PACU - hemodynamically stable.   Delay start of Pharmacological VTE agent (>24hrs) due to surgical blood loss or risk of bleeding: not applicable

## 2022-05-23 NOTE — Anesthesia Postprocedure Evaluation (Signed)
Anesthesia Post Note  Patient: Richard Matthews  Procedure(s) Performed: KNEE ARTHROSCOPY WITH MEDIAL MENISECTOMY (Right: Knee)  Patient location during evaluation: Phase II Anesthesia Type: General Level of consciousness: awake and alert and oriented Pain management: pain level controlled Vital Signs Assessment: post-procedure vital signs reviewed and stable Respiratory status: spontaneous breathing, nonlabored ventilation and respiratory function stable Cardiovascular status: blood pressure returned to baseline and stable Postop Assessment: no apparent nausea or vomiting Anesthetic complications: no   No notable events documented.   Last Vitals:  Vitals:   05/23/22 0924 05/23/22 0927  BP:  (!) 145/76  Pulse:  67  Resp: 15 15  Temp:  36.7 C  SpO2: 98% 97%    Last Pain:  Vitals:   05/23/22 0927  TempSrc: Oral  PainSc: 4                  Carlesha Seiple C Amrie Gurganus

## 2022-05-26 ENCOUNTER — Encounter (HOSPITAL_COMMUNITY): Payer: Self-pay | Admitting: Orthopedic Surgery

## 2022-05-30 ENCOUNTER — Encounter: Payer: Self-pay | Admitting: Orthopedic Surgery

## 2022-05-30 ENCOUNTER — Ambulatory Visit (INDEPENDENT_AMBULATORY_CARE_PROVIDER_SITE_OTHER): Payer: Managed Care, Other (non HMO) | Admitting: Orthopedic Surgery

## 2022-05-30 DIAGNOSIS — Z9889 Other specified postprocedural states: Secondary | ICD-10-CM

## 2022-05-30 NOTE — Progress Notes (Signed)
FOLLOW UP   Encounter Diagnosis  Name Primary?   S/P right knee arthroscopy (05/23/22) Yes     Chief Complaint  Patient presents with   Post-op Follow-up    Right knee 05/23/22      Richard Matthews is doing well he has full extension and 115 degrees of flexion he has no swelling his portals look clean he can start home exercises follow-up in 3 weeks

## 2022-06-19 ENCOUNTER — Encounter: Payer: Self-pay | Admitting: Orthopedic Surgery

## 2022-06-19 ENCOUNTER — Ambulatory Visit (INDEPENDENT_AMBULATORY_CARE_PROVIDER_SITE_OTHER): Payer: Managed Care, Other (non HMO) | Admitting: Orthopedic Surgery

## 2022-06-19 VITALS — Ht 71.0 in | Wt 156.0 lb

## 2022-06-19 DIAGNOSIS — M25461 Effusion, right knee: Secondary | ICD-10-CM

## 2022-06-19 DIAGNOSIS — Z9889 Other specified postprocedural states: Secondary | ICD-10-CM

## 2022-06-19 DIAGNOSIS — S83241D Other tear of medial meniscus, current injury, right knee, subsequent encounter: Secondary | ICD-10-CM

## 2022-06-19 MED ORDER — METHYLPREDNISOLONE ACETATE 40 MG/ML IJ SUSP
40.0000 mg | Freq: Once | INTRAMUSCULAR | Status: AC
  Administered 2022-06-19: 40 mg via INTRA_ARTICULAR

## 2022-06-19 NOTE — Progress Notes (Signed)
FOLLOW UP   Encounter Diagnoses  Name Primary?   S/P right knee arthroscopy (05/23/22) Yes   Acute medial meniscus tear of right knee, subsequent encounter      Chief Complaint  Patient presents with   Routine Post Op    Rt knee scope DOS 05/23/22    Richard Matthews is doing well.  He does have some swelling in his right knee.  It is a little tight.  He is having little discomfort.  He does take his ibuprofen as long as he takes it the swelling seems to stay down  He does have a moderate to large effusion I like to aspirated he is agreeable  Procedure note injection and aspiration right knee joint  Verbal consent was obtained to aspirate and inject the right knee joint   Timeout was completed to confirm the site of aspiration and injection  An 18-gauge needle was used to aspirate the knee joint from a suprapatellar lateral approach.  The medications used were 40 mg of Depo-Medrol and 1% lidocaine 3 cc  Anesthesia was provided by ethyl chloride and the skin was prepped with alcohol.  After cleaning the skin with alcohol an 18-gauge needle was used to aspirate the right knee joint.  We obtained 30 cc of fluid amber-colored  We follow this by injection of 40 mg of Depo-Medrol and 3 cc 1% lidocaine.  There were no complications. A sterile bandage was applied.  I would like to see him in a month he can continue the ibuprofen

## 2022-06-22 ENCOUNTER — Encounter: Payer: Self-pay | Admitting: Nurse Practitioner

## 2022-06-23 ENCOUNTER — Other Ambulatory Visit: Payer: Self-pay

## 2022-06-23 DIAGNOSIS — I10 Essential (primary) hypertension: Secondary | ICD-10-CM

## 2022-06-23 DIAGNOSIS — E785 Hyperlipidemia, unspecified: Secondary | ICD-10-CM

## 2022-06-23 MED ORDER — ATORVASTATIN CALCIUM 20 MG PO TABS: 20.0000 mg | ORAL_TABLET | Freq: Every day | ORAL | 3 refills | Status: DC

## 2022-06-23 MED ORDER — LOSARTAN POTASSIUM 100 MG PO TABS: 100.0000 mg | ORAL_TABLET | Freq: Every day | ORAL | 3 refills | Status: DC

## 2022-07-25 ENCOUNTER — Encounter: Payer: Managed Care, Other (non HMO) | Admitting: Orthopedic Surgery

## 2022-08-04 ENCOUNTER — Ambulatory Visit (INDEPENDENT_AMBULATORY_CARE_PROVIDER_SITE_OTHER): Payer: Managed Care, Other (non HMO) | Admitting: Orthopedic Surgery

## 2022-08-04 ENCOUNTER — Encounter: Payer: Self-pay | Admitting: Orthopedic Surgery

## 2022-08-04 DIAGNOSIS — M25461 Effusion, right knee: Secondary | ICD-10-CM

## 2022-08-04 MED ORDER — METHYLPREDNISOLONE ACETATE 40 MG/ML IJ SUSP
40.0000 mg | Freq: Once | INTRAMUSCULAR | Status: AC
  Administered 2022-08-04: 40 mg via INTRA_ARTICULAR

## 2022-08-04 MED ORDER — CELECOXIB 200 MG PO CAPS: 200.0000 mg | ORAL_CAPSULE | Freq: Every day | ORAL | 5 refills | Status: DC

## 2022-08-04 NOTE — Progress Notes (Signed)
Chief Complaint  Patient presents with   Post-op Follow-up    Knee is improving after [facility] 05/23/22 is running now.     56 year old male status post arthroscopy right knee had a partial medial meniscectomy but also had a significant amount of synovitis unclear why  Patient continues to have swelling in the right knee despite 1 aspiration injection and taking ibuprofen.  He says he takes ibuprofen probably not 3 times a day.  He is able to run about 2 miles when he goes running.  He says he feels better when he is running versus when he is not running  His exam shows he does have a knee effusion with decreased flexion compared to the left knee he has some mild tenderness over the medial side of the joint where the meniscus was resected  Recommended aspiration injection he agreed  We aspirated the knee from the lateral approach and obtained bloody fluid again.  Approximately 15 to 20 cc.  We put in 80 mg of Depo-Medrol this time and start him on Celebrex once a day to decrease the amount of ibuprofen he was taking  He has had no GI symptoms at this time  He can continue with running, use ice after running start the Celebrex once a day let me know if he has any trouble  Otherwise follow-up as needed Meds ordered this encounter  Medications   methylPREDNISolone acetate (DEPO-MEDROL) injection 40 mg   methylPREDNISolone acetate (DEPO-MEDROL) injection 40 mg   celecoxib (CELEBREX) 200 MG capsule    Sig: Take 1 capsule (200 mg total) by mouth daily.    Dispense:  30 capsule    Refill:  5

## 2022-08-20 ENCOUNTER — Encounter: Payer: Self-pay | Admitting: Internal Medicine

## 2022-08-20 ENCOUNTER — Ambulatory Visit (INDEPENDENT_AMBULATORY_CARE_PROVIDER_SITE_OTHER): Payer: Managed Care, Other (non HMO) | Admitting: Internal Medicine

## 2022-08-20 VITALS — BP 108/64 | HR 82 | Ht 71.0 in | Wt 160.0 lb

## 2022-08-20 DIAGNOSIS — J0111 Acute recurrent frontal sinusitis: Secondary | ICD-10-CM

## 2022-08-20 DIAGNOSIS — Z9109 Other allergy status, other than to drugs and biological substances: Secondary | ICD-10-CM

## 2022-08-20 MED ORDER — AMOXICILLIN-POT CLAVULANATE 875-125 MG PO TABS: 1.0000 | ORAL_TABLET | Freq: Two times a day (BID) | ORAL | 0 refills | Status: DC

## 2022-08-20 MED ORDER — CLARITHROMYCIN 500 MG PO TABS: 500.0000 mg | ORAL_TABLET | Freq: Two times a day (BID) | ORAL | 0 refills | Status: DC

## 2022-08-20 MED ORDER — AZELASTINE HCL 0.1 % NA SOLN: 1.0000 | Freq: Two times a day (BID) | NASAL | 5 refills | Status: DC

## 2022-08-20 NOTE — Progress Notes (Signed)
Acute Office Visit  Subjective:    Patient ID: Richard Matthews, male    DOB: 1966/07/07, 56 y.o.   MRN: [id number]  Chief Complaint  Patient presents with   Facial Pain    Since 08/14/22   Cough    Since 08/14/22   Nasal Congestion    Since 08/14/22    HPI Patient is in today for complaint of nasal congestion, postnasal drip, sinus pressure related headache and cough for the last 1 week.  He denies any fever or chills currently.  Denies any dyspnea or wheezing currently.  He has had sinus surgeries in the past.  He has history of recurrent sinusitis, and has to take Augmentin and clarithromycin for proper coverage.  He is allergic to levofloxacin.  Past Medical History:  Diagnosis Date   Hypertension     Past Surgical History:  Procedure Laterality Date   KNEE ARTHROSCOPY W/ ACL RECONSTRUCTION Right    KNEE ARTHROSCOPY WITH MEDIAL MENISECTOMY Right 05/23/2022   Procedure: KNEE ARTHROSCOPY WITH MEDIAL MENISECTOMY;  Surgeon: Carole Civil, MD;  Location: [facility];  Service: Orthopedics;  Laterality: Right;    Family History  Problem Relation Age of Onset   AAA (abdominal aortic aneurysm) Mother    Cancer Father     Social History   Socioeconomic History   Marital status: Married    Spouse name: Not on file   Number of children: 2   Years of education: Not on file   Highest education level: Not on file  Occupational History   Occupation: Runs an Building surveyor  Tobacco Use   Smoking status: Never   Smokeless tobacco: Never  Vaping Use   Vaping Use: Never used  Substance and Sexual Activity   Alcohol use: Yes    Alcohol/week: 2.0 standard drinks of alcohol    Types: 2 Cans of beer per week    Comment: 15 per week; at least 2 per night   Drug use: Not Currently   Sexual activity: Yes  Other Topics Concern   Not on file  Social History Narrative   Not on file   Social Determinants of Health   Financial Resource Strain: Low Risk  (06/29/2020)    Overall Financial Resource Strain (CARDIA)    Difficulty of Paying Living Expenses: Not hard at all  Food Insecurity: No Food Insecurity (06/29/2020)   Hunger Vital Sign    Worried About Running Out of Food in the Last Year: Never true    [place] in the Last Year: Never true  Transportation Needs: No Transportation Needs (06/29/2020)   PRAPARE - Hydrologist (Medical): No    Lack of Transportation (Non-Medical): No  Physical Activity: Sufficiently Active (06/29/2020)   Exercise Vital Sign    Days of Exercise per Week: 7 days    Minutes of Exercise per Session: 40 min  Stress: No Stress Concern Present (06/29/2020)   [place]    Feeling of Stress : Not at all  Social Connections: Moderately Isolated (06/29/2020)   Social Connection and Isolation Panel [NHANES]    Frequency of Communication with Friends and Family: More than three times a week    Frequency of Social Gatherings with Friends and Family: More than three times a week    Attends Religious Services: Never    Marine scientist or Organizations: No    Attends Archivist Meetings: Never  Marital Status: Married  Catering manager Violence: Not At Risk (06/29/2020)   Humiliation, Afraid, Rape, and Kick questionnaire    Fear of Current or Ex-Partner: No    Emotionally Abused: No    Physically Abused: No    Sexually Abused: No    Outpatient Medications Prior to Visit  Medication Sig Dispense Refill   atorvastatin (LIPITOR) 20 MG tablet Take 1 tablet (20 mg total) by mouth daily. 90 tablet 3   celecoxib (CELEBREX) 200 MG capsule Take 1 capsule (200 mg total) by mouth daily. 30 capsule 5   losartan (COZAAR) 100 MG tablet Take 1 tablet (100 mg total) by mouth daily. 90 tablet 3   sildenafil (VIAGRA) 100 MG tablet Take 0.5-1 tablets (50-100 mg total) by mouth daily as needed for erectile dysfunction. 30 tablet 11    ibuprofen (ADVIL) 800 MG tablet Take 1 tablet (800 mg total) by mouth every 8 (eight) hours as needed. (Patient not taking: Reported on 08/20/2022) 90 tablet 1   No facility-administered medications prior to visit.    Allergies  Allergen Reactions   Levofloxacin Hives   Eggs Or Egg-Derived Products Hives    Review of Systems  Constitutional:  Negative for chills and fever.  HENT:  Positive for congestion, postnasal drip, rhinorrhea and sinus pressure.   Respiratory:  Positive for cough. Negative for shortness of breath.   Cardiovascular:  Negative for chest pain and palpitations.  Genitourinary:  Negative for dysuria and hematuria.  Musculoskeletal:  Negative for neck pain and neck stiffness.  Skin:  Negative for rash.  Psychiatric/Behavioral:  Negative for agitation and behavioral problems.        Objective:    Physical Exam Constitutional:      General: He is not in acute distress.    Appearance: He is not diaphoretic.  HENT:     Head: Normocephalic and atraumatic.     Nose: Congestion present.     Right Sinus: Frontal sinus tenderness present.     Left Sinus: Frontal sinus tenderness present.     Mouth/Throat:     Pharynx: Posterior oropharyngeal erythema present.  Eyes:     General: No scleral icterus.    Extraocular Movements: Extraocular movements intact.  Cardiovascular:     Rate and Rhythm: Normal rate and regular rhythm.     Heart sounds: Normal heart sounds. No murmur heard. Pulmonary:     Breath sounds: Normal breath sounds. No wheezing or rales.  Musculoskeletal:     Cervical back: Neck supple. No tenderness.     Right lower leg: No edema.     Left lower leg: No edema.  Skin:    General: Skin is warm.     Findings: No rash.  Neurological:     General: No focal deficit present.     Mental Status: He is alert and oriented to person, place, and time.  Psychiatric:        Mood and Affect: Mood normal.        Behavior: Behavior normal.     BP 108/64  (BP Location: Right Arm, Patient Position: Sitting, Cuff Size: Normal)   Pulse 82   Ht 5\' 11"  (1.803 m)   Wt 160 lb (72.6 kg)   SpO2 96%   BMI 22.32 kg/m  Wt Readings from Last 3 Encounters:  08/20/22 160 lb (72.6 kg)  06/19/22 156 lb (70.8 kg)  05/21/22 156 lb 1.4 oz (70.8 kg)        Assessment & Plan:  Problem List Items Addressed This Visit       Respiratory   Acute recurrent frontal sinusitis - Primary Has history of recurrent and resistant sinusitis Started Augmentin Added clarithromycin for atypical coverage according to the patient's history Nasal saline spray as needed for nasal congestion    Relevant Medications   amoxicillin-clavulanate (AUGMENTIN) 875-125 MG tablet   clarithromycin (BIAXIN) 500 MG tablet   azelastine (ASTELIN) 0.1 % nasal spray   Other Visit Diagnoses     Environmental allergies     Did not tolerate Flonase in the past Added azelastine nasal spray Advised to take Zyrtec or Allegra for allergies   Relevant Medications   azelastine (ASTELIN) 0.1 % nasal spray        Meds ordered this encounter  Medications   amoxicillin-clavulanate (AUGMENTIN) 875-125 MG tablet    Sig: Take 1 tablet by mouth 2 (two) times daily.    Dispense:  14 tablet    Refill:  0   clarithromycin (BIAXIN) 500 MG tablet    Sig: Take 1 tablet (500 mg total) by mouth 2 (two) times daily.    Dispense:  14 tablet    Refill:  0   azelastine (ASTELIN) 0.1 % nasal spray    Sig: Place 1 spray into both nostrils 2 (two) times daily. Use in each nostril as directed    Dispense:  30 mL    Refill:  5     Mariadel Mruk Concha Se, MD

## 2022-09-29 ENCOUNTER — Encounter: Payer: Managed Care, Other (non HMO) | Admitting: Nurse Practitioner

## 2022-09-30 ENCOUNTER — Encounter: Payer: Managed Care, Other (non HMO) | Admitting: Internal Medicine

## 2022-11-07 ENCOUNTER — Other Ambulatory Visit: Payer: Self-pay | Admitting: Internal Medicine

## 2022-11-07 DIAGNOSIS — N529 Male erectile dysfunction, unspecified: Secondary | ICD-10-CM

## 2022-11-12 ENCOUNTER — Other Ambulatory Visit: Payer: Self-pay

## 2022-11-12 ENCOUNTER — Other Ambulatory Visit: Payer: Self-pay | Admitting: Internal Medicine

## 2022-11-12 ENCOUNTER — Telehealth: Payer: Self-pay | Admitting: Internal Medicine

## 2022-11-12 DIAGNOSIS — N529 Male erectile dysfunction, unspecified: Secondary | ICD-10-CM

## 2022-11-12 DIAGNOSIS — E785 Hyperlipidemia, unspecified: Secondary | ICD-10-CM

## 2022-11-12 DIAGNOSIS — I10 Essential (primary) hypertension: Secondary | ICD-10-CM

## 2022-11-12 MED ORDER — ATORVASTATIN CALCIUM 20 MG PO TABS: 20.0000 mg | ORAL_TABLET | Freq: Every day | ORAL | 3 refills | Status: DC

## 2022-11-12 MED ORDER — LOSARTAN POTASSIUM 100 MG PO TABS: 100.0000 mg | ORAL_TABLET | Freq: Every day | ORAL | 3 refills | Status: DC

## 2022-11-12 MED ORDER — SILDENAFIL CITRATE 100 MG PO TABS: 50.0000 mg | ORAL_TABLET | Freq: Every day | ORAL | 11 refills | Status: DC | PRN

## 2022-11-12 NOTE — Telephone Encounter (Signed)
Prescription Request  11/12/2022  Is this a "Controlled Substance" medicine? No  LOV: Visit date not found  What is the name of the medication or equipment?  Losartan (COZAAR) 100 MG tablet   sildenafil (VIAGRA) 100 MG tablet   atorvastatin (LIPITOR) 20 MG tablet   Have you contacted your pharmacy to request a refill? Yes   Which pharmacy would you like this sent to?  [facility] [place] [facility] [id number] [place] [address] Phone: [phone] Fax: [phone]    Patient notified that their request is being sent to the clinical staff for review and that they should receive a response within 2 business days.   Please advise at Home [phone]       CONTACTED [place]

## 2023-01-01 ENCOUNTER — Encounter: Payer: Self-pay | Admitting: Radiology

## 2023-06-15 IMAGING — MR MR KNEE*R* W/O CM
7 series · 40 of 40 positions shown · non-contrast
Comparison: Right knee radiographs 04/07/2022

CLINICAL DATA: Chronic right knee pain.

EXAM:
MRI OF THE RIGHT KNEE WITHOUT CONTRAST
TECHNIQUE: Multiplanar, multisequence MR imaging of the knee was performed. No
intravenous contrast was administered.

[Series 12: T2 fat-sat · axial · right · 4.0mm · 0.47mm/px · z∈[-178,-53]mm · 7 of 26 slices shown (1 of 3)]
[im 1/26]
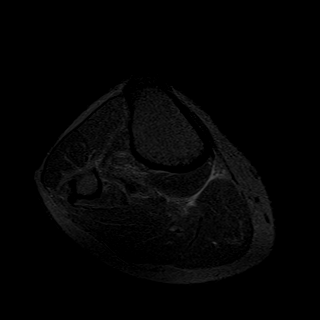
[im 5/26]
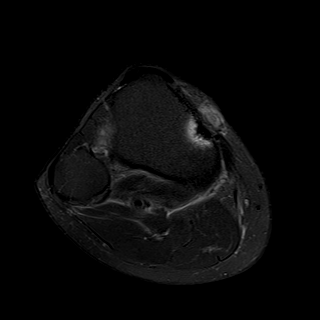
[im 9/26]
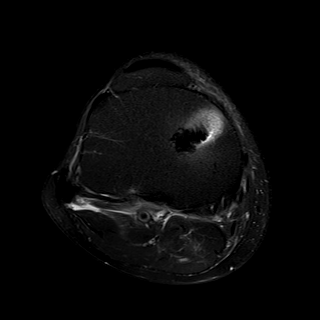
[im 13/26]
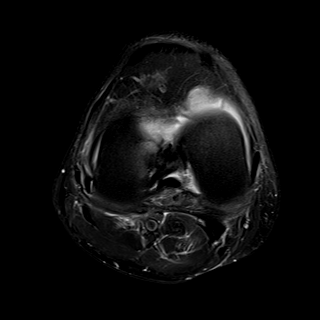
[im 17/26]
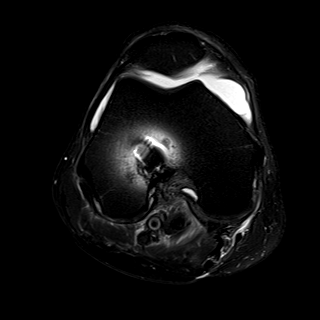
[im 21/26]
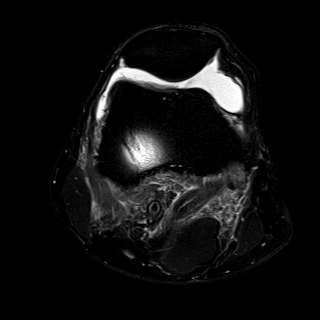
[im 26/26]
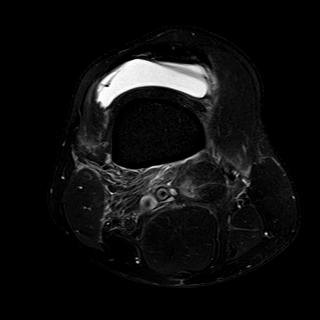

[Series 13: T1 · coronal · right · 4.0mm · 0.59mm/px · 5 of 24 slices shown]
[im 1/24]
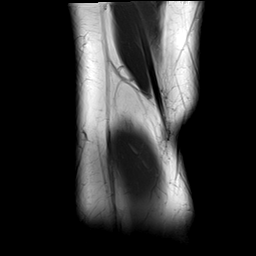
[im 6/24]
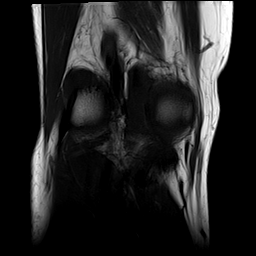
[im 12/24]
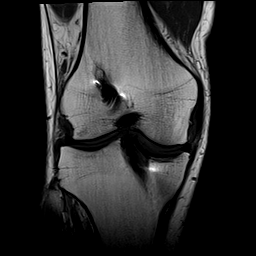
[im 18/24]
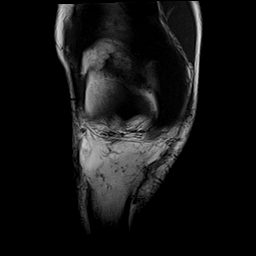
[im 24/24]
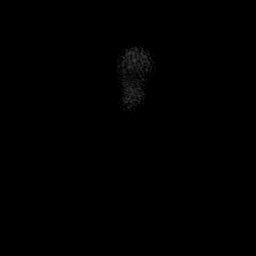

[Series 14: T2 fat-sat · coronal · right · 4.0mm · 0.59mm/px · 6 of 28 slices shown (2 of 3)]
[im 1/28]
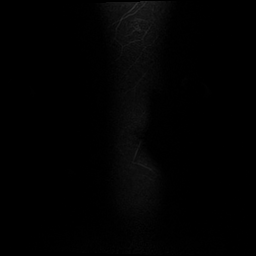
[im 6/28]
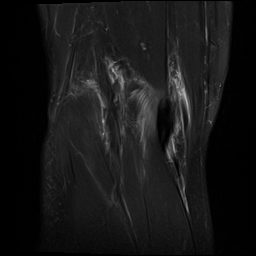
[im 11/28]
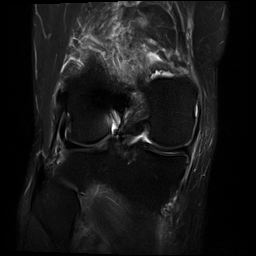
[im 17/28]
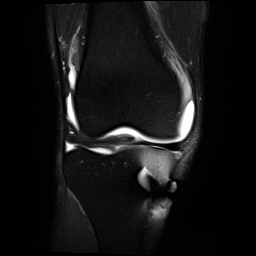
[im 22/28]
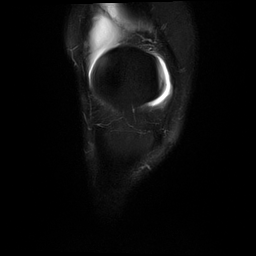
[im 28/28]
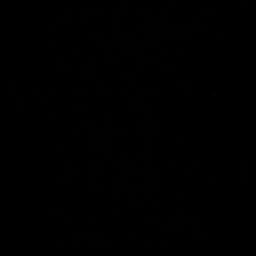

[Series 15: PD fat-sat · coronal · right · 4.0mm · 0.59mm/px · 6 of 28 slices shown (1 of 2)]
[im 1/28]
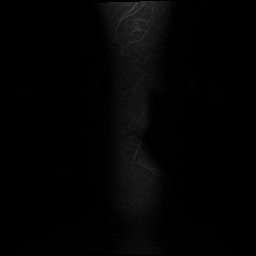
[im 6/28]
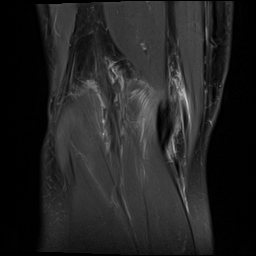
[im 11/28]
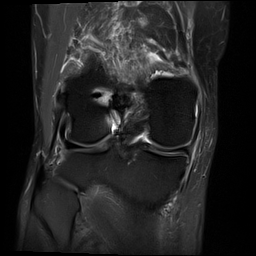
[im 17/28]
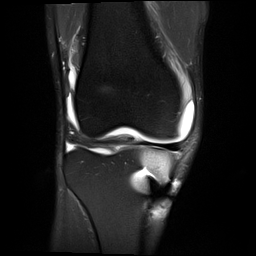
[im 22/28]
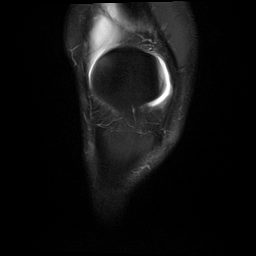
[im 28/28]
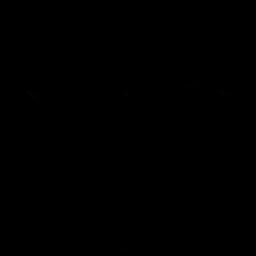

[Series 16: PD fat-sat · sagittal · right · 3.0mm · 0.52mm/px · 8 of 36 slices shown (2 of 2)]
[im 1/36]
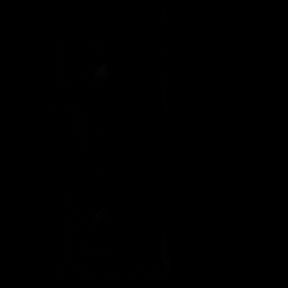
[im 6/36]
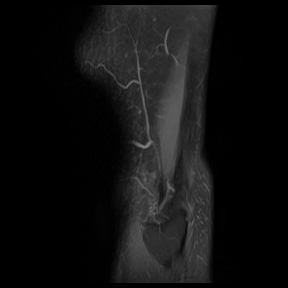
[im 11/36]
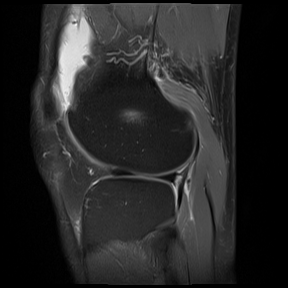
[im 16/36]
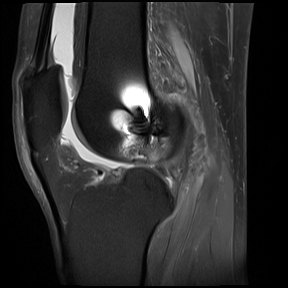
[im 21/36]
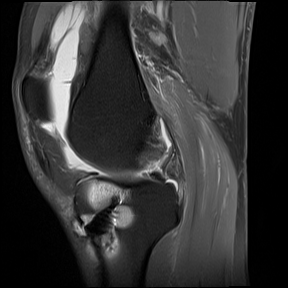
[im 26/36]
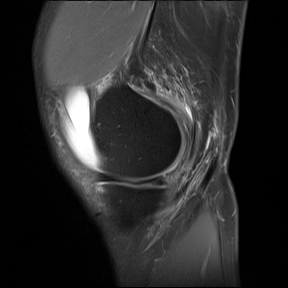
[im 31/36]
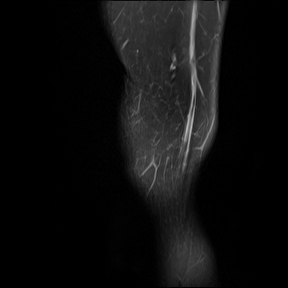
[im 36/36]
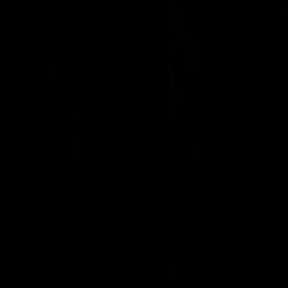

[Series 17: T2 fat-sat · sagittal · right · 3.0mm · 0.59mm/px · 6 of 28 slices shown (3 of 3)]
[im 1/28]
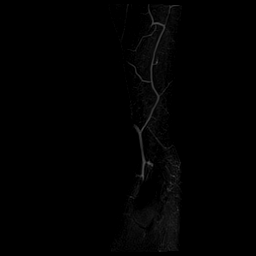
[im 6/28]
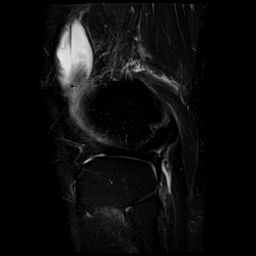
[im 11/28]
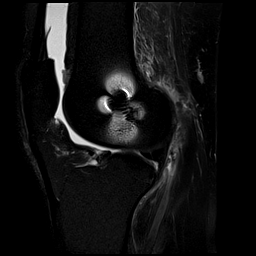
[im 17/28]
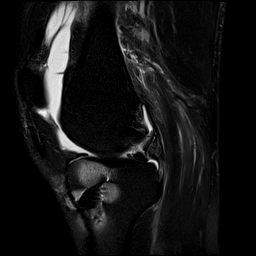
[im 22/28]
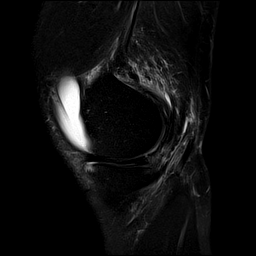
[im 28/28]
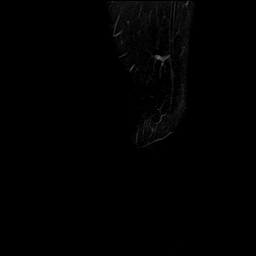

[Series 18: PD · coronal · right · 2.0mm · 0.47mm/px · 2 of 10 slices shown]
[im 1/10]
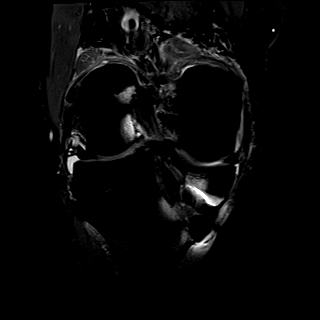
[im 10/10]
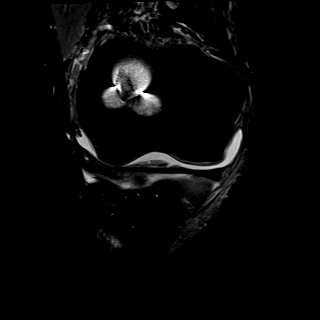

[40 of 40 positions shown; findings below may reference images not displayed]

FINDINGS: MENISCI

Medial meniscus: There is mild attenuation of the inferior aspect of
the posterior horn of the medial meniscus diffusely. There is an
oblique tear extending through the inferior articular surface of the
middle third of the meniscal triangle of the posterior horn age
indeterminate. This extends into the posterior segment of the body
of the medial meniscus (coronal series 15, image 12).

Lateral meniscus:  Intact.

LIGAMENTS

Cruciates: There are postsurgical changes of ACL reconstruction
including metallic artifact from distal femoral and proximal tibial
screws. The ACL graft fibers appear parallel and intact.

Collaterals: The medial collateral ligament is intact. The fibular
collateral ligament, biceps femoris tendon, iliotibial band, and
popliteus tendon are intact.

CARTILAGE

Patellofemoral:  Intact.

Medial: Within the limitations of metallic artifact common no
cartilage defect is seen.

Lateral:  Intact.

Joint: Moderatejoint effusion. Normal Hoffa's fat pad. No plical
thickening.

Popliteal Fossa:  No Baker's cyst.

Extensor Mechanism: Intact quadriceps tendon and patellar tendon.
Mild edema and swelling within the subcutaneous fat just proximal to
the anterior patellar tendon.

Bones:  No acute fracture or dislocation.

Other: None.
IMPRESSION: 1. Status post ACL reconstruction.  The graft fibers appear intact.
2. Oblique undersurface tear diffusely involving the posterior horn
of the medial meniscus. This is age indeterminate. No comparison MRI
is available.
3. Moderate joint effusion.
4. Mild edema and swelling within the subcutaneous fat just anterior
to the proximal patellar tendon but normal appearance of the
patellar tendon.

## 2023-06-18 ENCOUNTER — Encounter: Payer: Self-pay | Admitting: Orthopedic Surgery

## 2023-06-18 ENCOUNTER — Ambulatory Visit: Payer: Managed Care, Other (non HMO) | Admitting: Orthopedic Surgery

## 2023-06-18 VITALS — BP 147/84 | HR 65 | Ht 71.0 in | Wt 163.8 lb

## 2023-06-18 DIAGNOSIS — M629 Disorder of muscle, unspecified: Secondary | ICD-10-CM | POA: Diagnosis not present

## 2023-06-18 DIAGNOSIS — S83241D Other tear of medial meniscus, current injury, right knee, subsequent encounter: Secondary | ICD-10-CM | POA: Diagnosis not present

## 2023-06-18 NOTE — Patient Instructions (Signed)
Physical therapy has been ordered for you at [facility]. They should call you to schedule, [phone] is the phone number to call, if you want to call to schedule.   

## 2023-06-18 NOTE — Progress Notes (Signed)
Chief Complaint  Patient presents with   Routine Post Op    R knee DOS 05/23/22. Would like to discuss a referral to PT for hamstring tightness for upcoming marathon.    Nik visit is again on this occasion planning a half marathon  He has some chronic tightness in his right hamstring  And he would like evaluation and possible therapy  He is also very pleased with his right knee surgery  Examination  Tight hamstrings both passive popliteal test 40 degrees stretch to 30 degrees  This is noted on both sides  No swelling full range of motion of the right knee  Encounter Diagnoses  Name Primary?   Acute medial meniscus tear of right knee, subsequent encounter    Hamstring tightness of both lower extremities Yes    Recommend physical therapy follow-up as needed

## 2023-07-20 ENCOUNTER — Ambulatory Visit (HOSPITAL_COMMUNITY): Payer: Managed Care, Other (non HMO) | Admitting: Physical Therapy

## 2023-08-04 ENCOUNTER — Ambulatory Visit (HOSPITAL_COMMUNITY): Payer: Managed Care, Other (non HMO) | Attending: Orthopedic Surgery | Admitting: Physical Therapy

## 2023-08-04 ENCOUNTER — Other Ambulatory Visit: Payer: Self-pay

## 2023-08-04 DIAGNOSIS — S83241D Other tear of medial meniscus, current injury, right knee, subsequent encounter: Secondary | ICD-10-CM | POA: Diagnosis not present

## 2023-08-04 DIAGNOSIS — M79604 Pain in right leg: Secondary | ICD-10-CM | POA: Diagnosis present

## 2023-08-04 DIAGNOSIS — M25561 Pain in right knee: Secondary | ICD-10-CM | POA: Insufficient documentation

## 2023-08-04 DIAGNOSIS — M6281 Muscle weakness (generalized): Secondary | ICD-10-CM | POA: Diagnosis present

## 2023-08-04 DIAGNOSIS — M25551 Pain in right hip: Secondary | ICD-10-CM | POA: Diagnosis present

## 2023-08-04 DIAGNOSIS — G8929 Other chronic pain: Secondary | ICD-10-CM | POA: Insufficient documentation

## 2023-08-04 NOTE — Therapy (Signed)
OUTPATIENT PHYSICAL THERAPY LOWER EXTREMITY EVALUATION   Patient Name: Richard Matthews MRN: [id number] DOB:[birth year], 57 y.o., male Today's Date: 08/04/2023  END OF SESSION:  PT End of Session - 08/04/23 0932     Visit Number 1    Number of Visits 8    Date for PT Re-Evaluation 09/29/23    Authorization Type [employer]    PT Start Time 0932    PT Stop Time 1015    PT Time Calculation (min) 43 min    Activity Tolerance Patient tolerated treatment well    Behavior During Therapy [facility] for tasks assessed/performed             Past Medical History:  Diagnosis Date   Hypertension    Past Surgical History:  Procedure Laterality Date   KNEE ARTHROSCOPY W/ ACL RECONSTRUCTION Right    KNEE ARTHROSCOPY WITH MEDIAL MENISECTOMY Right 05/23/2022   Procedure: KNEE ARTHROSCOPY WITH MEDIAL MENISECTOMY;  Surgeon: [name], MD;  Location: [facility];  Service: Orthopedics;  Laterality: Right;   Patient Active Problem List   Diagnosis Date Noted   Tear of medial meniscus of right knee, current    Acute pain of right knee 03/28/2022   Erectile dysfunction 05/09/2021   Encounter for general adult medical examination with abnormal findings 05/09/2021   HLD (hyperlipidemia) 05/09/2021   Essential hypertension 08/21/2020   Chronic sinusitis 06/13/2019    PCP: [name], MD  REFERRING PROVIDER: [name], MD  REFERRING DIAG: [id number] (ICD-10-CM) - Acute medial meniscus tear of right knee, subsequent encounter  THERAPY DIAG:  Pain in right hip  Chronic pain of right knee  Pain in right leg  Muscle weakness (generalized)  Rationale for Evaluation and Treatment: Rehabilitation  ONSET DATE: 22-23 years ago  SUBJECTIVE:   SUBJECTIVE STATEMENT: Pt states he has had past injuries on his R LE; however, is trying to remain active. Pt reports R lateral hip pain and knee pain from skiing accident many years ago. Pt is a marathon runner. Biggest problem has been  a class 2 tear in his hamstring in the past. Pt reports he will run and feels that pull every once in a while. Exacerbated 5 years ago while running when he did one step and one muscle in that middle hamstring caused sharp pain. States it will be sore for a few weeks after. Since then it will happen every once in a while. Reports he will stretch and foam roll the hamstring. Pt states it's been since June when he had it last occur. Typically runs 3-4 days a week depending on if he's traveling.  PERTINENT HISTORY: R ACL and R meniscal injury  PAIN:  Are you having pain? Yes: NPRS scale: 0 currently, at worst 5/10 Pain location: R Middle of hamstring and slightly lateral at times Pain description: sharp pain Aggravating factors: Stepping wrong while running (states primarily on push off)  Relieving factors: Rest  PRECAUTIONS: None  RED FLAGS: None   WEIGHT BEARING RESTRICTIONS: No  FALLS:  Has patient fallen in last 6 months? 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 Likes running   PLOF: Independent  PATIENT GOALS: preventative care to reduce injuries as he gets older and runs, decrease instances of hamstring pain with running  NEXT MD VISIT: n/a  OBJECTIVE:  Note: Objective measures were completed at Evaluation unless otherwise  noted.  DIAGNOSTIC FINDINGS: n/a  PATIENT SURVEYS:  FOTO 84; predicted 6  COGNITION: Overall cognitive status: Within functional limits for tasks assessed     SENSATION: WFL  EDEMA:  None  MUSCLE LENGTH: Hamstrings: Right 60 deg; Left 70 deg Thomas test: WNL  POSTURE: No Significant postural limitations  PALPATION: No overt tenderness to palpation  LOWER EXTREMITY ROM:  Active ROM Right eval Left eval  Hip flexion    Hip extension    Hip abduction    Hip adduction    Hip internal rotation     Hip external rotation    Knee flexion    Knee extension    Ankle dorsiflexion    Ankle plantarflexion    Ankle inversion    Ankle eversion     (Blank rows = not tested)  LOWER EXTREMITY MMT:  MMT Right eval Left eval  Hip flexion 4- 4  Hip extension 4- 4-  Hip abduction 4 5  Hip adduction 4 4+  Hip internal rotation    Hip external rotation    Knee flexion 5 5  Knee extension 5 5  Ankle dorsiflexion    Ankle plantarflexion    Ankle inversion    Ankle eversion     (Blank rows = not tested)  LOWER EXTREMITY SPECIAL TESTS:  Not tested  FUNCTIONAL TESTS:  Single leg sit to stand: WNL bilat, mild instability noted bilat Double leg jumping WNL Single leg jumping WNL Running: noted hip drop bilat with diminished hip extension  GAIT: Distance walked: Into clinic Assistive device utilized: None Level of assistance: Complete Independence Comments: WNL   TODAY'S TREATMENT:                                                                                                                              DATE:  08/04/23 See HEP below    PATIENT EDUCATION:  Education details: Exam findings, POC, initial HEP Person educated: Patient Education method: Explanation, Demonstration, and Handouts Education comprehension: verbalized understanding, returned demonstration, and needs further education  HOME EXERCISE PROGRAM: Access Code: [email or web address] URL: [email or web address] Date: 08/04/2023 Prepared by: Vernon Prey April Kirstie Peri  Exercises - Single Leg Quarter Squat with Swiss Ball at [employer]  - 1 x daily - 7 x weekly - 3 sets - 10 reps - Isometric Gluteus Medius at Wall  - 1 x daily - 7 x weekly - 3 sets - 10 reps - Standing Hip Extension with Leg Bent and Support Isometrics into wall - 1 x daily - 7 x weekly - 3 sets - 10 reps  ASSESSMENT:  CLINICAL IMPRESSION: Patient is a 57 y.o. M who was seen today for physical therapy evaluation and treatment for R LE pain. PMH  significant for R LE injuries including ACL, meniscus and hamstring tears. Assessment significant for gross hip weakness and decreased hip stability and power. It appears that pt may be over utilizing hamstrings vs his glutes during push off  with running causing strains. Demos hip drop when performing dynamic movements on single leg. Pt will benefit from PT to prevent further injuries and improve his strength and power for running and recreational tasks. Pt travels for work and notes he may have some difficulty with coming consistently.   OBJECTIVE IMPAIRMENTS: decreased balance, decreased coordination, decreased strength, impaired flexibility, improper body mechanics, and pain.   ACTIVITY LIMITATIONS: locomotion level  PARTICIPATION LIMITATIONS: community activity and Recreational activities/running  PERSONAL FACTORS: Fitness, Past/current experiences, and Time since onset of injury/illness/exacerbation are also affecting patient's functional outcome.   REHAB POTENTIAL: Good  CLINICAL DECISION MAKING: Stable/uncomplicated  EVALUATION COMPLEXITY: Low   GOALS: Goals reviewed with patient? Yes  SHORT TERM GOALS: Target date: 09/01/2023  Pt will be ind with initial HEP Baseline: Goal status: INITIAL  2.  Pt will be able to demo at least 4+/5 bilat hip strength Baseline:  Goal status: INITIAL   LONG TERM GOALS: Target date: 09/29/2023   Pt will be ind with management and progression of HEP Baseline:  Goal status: INITIAL  2.  Pt will be able to demo 5/5 bilat hip strength for reduced hamstring reliance with push off Baseline:  Goal status: INITIAL  3.  Pt will be able to perform single leg squat x10 without hip drop to demo improved hip stability Baseline:  Goal status: INITIAL  4.  Pt will be able to run 3 miles without hip drop and no hamstring strain Baseline:  Goal status: INITIAL    PLAN:  PT FREQUENCY: 1x/week as able (pt travels for work)  PT DURATION: 8  weeks  PLANNED INTERVENTIONS: Therapeutic exercises, Therapeutic activity, Neuromuscular re-education, Balance training, Gait training, Patient/Family education, Self Care, Joint mobilization, Dry Needling, Electrical stimulation, Cryotherapy, Moist heat, Taping, Manual therapy, and Re-evaluation  PLAN FOR NEXT SESSION: Assess response to HEP. Work on higher level hip strengthening, stability, power/agility/plyometrics. Work on hip extension without hamstring over activation and hip strengthening without hip drop.    Jenna Ardoin April Ma L Imberly Troxler, PT 08/04/2023, 12:44 PM

## 2024-01-12 ENCOUNTER — Other Ambulatory Visit: Payer: Self-pay | Admitting: Internal Medicine

## 2024-01-12 DIAGNOSIS — N529 Male erectile dysfunction, unspecified: Secondary | ICD-10-CM

## 2024-01-12 DIAGNOSIS — I10 Essential (primary) hypertension: Secondary | ICD-10-CM

## 2024-01-12 DIAGNOSIS — E785 Hyperlipidemia, unspecified: Secondary | ICD-10-CM

## 2024-01-14 ENCOUNTER — Telehealth: Payer: Self-pay

## 2024-01-14 DIAGNOSIS — I10 Essential (primary) hypertension: Secondary | ICD-10-CM

## 2024-01-14 DIAGNOSIS — E785 Hyperlipidemia, unspecified: Secondary | ICD-10-CM

## 2024-01-14 MED ORDER — LOSARTAN POTASSIUM 100 MG PO TABS: 100.0000 mg | ORAL_TABLET | Freq: Every day | ORAL | 3 refills | Status: DC

## 2024-01-14 MED ORDER — ATORVASTATIN CALCIUM 20 MG PO TABS
20.0000 mg | ORAL_TABLET | Freq: Every day | ORAL | 3 refills | Status: DC
Start: 2024-01-14 — End: 2024-02-22

## 2024-01-14 NOTE — Telephone Encounter (Signed)
 Copied from CRM [id number]. Topic: Clinical - Prescription Issue >> Jan 14, 2024 11:18 AM Fuller Mandril wrote: Reason for CRM: Patient called states refill was rejected. Advised appt needed for further refills. Scheduled appt. 1st available with providers in May. Patient stated he wanted to continue to see Dr Allena Katz. 1st available April but patient will be out of town in April but schedule for his next available in May. Patient only has 7 days left of losartan (COZAAR) 100 MG tablet and atorvastatin (LIPITOR) 20 MG tablet. Also, patient is leaving to go out of town for a while next week. Thank You    Patient not seen since 08/2022 for acute visit.

## 2024-01-15 ENCOUNTER — Ambulatory Visit: Payer: Self-pay | Admitting: Internal Medicine

## 2024-01-15 NOTE — Telephone Encounter (Signed)
 Refills sent on 01/14/2024

## 2024-01-15 NOTE — Telephone Encounter (Signed)
 Copied from CRM [id number]. Topic: Clinical - Medication Question >> Jan 15, 2024 10:16 AM DeAngela L wrote: Reason for CRM: Patient tried to get a refill on blood pressure meds before going out of town, now can only have a refill after next Dr appt on 03/16/24 but the patient has six pills left   Chief Complaint: Medication Refill Question Symptoms: n/a Frequency: n/a Pertinent Negatives: Patient denies any concerns/complaints Disposition: [] ED /[] Urgent Care (no appt availability in office) / [] Appointment(In office/virtual)/ []  [facility]/ [] Home Care/ [] Refused Recommended Disposition /[] [facility]/ []  Follow-up with PCP Additional Notes: Patient called and advised that he needed a refill on Losartan and Atorvastatin.  He only has six pills left of each medication. Patient states he is going out of town early on Monday and would like to be able to get these medications refilled. Upon reviewing patient's chart, it is noted yesterday 01/14/2024 at 3:27 pm patient's provider Dr Christel Mormon sent the refills to the pharmacy.  Patient checked his app while on the phone with this RN and he verified that the pharmacy now says that the prescription will be ready for pickup on Monday.  Patient denies any additional questions or concerns at this time. Patient is going to contact his pharmacy at this time to see if he can get them earlier than Monday since he will be leaving that day to go out of town.   Reason for Disposition . [1] Follow-up call to recent contact AND [2] information only call, no triage required    It was noted yesterday that the prescription refills were sent to the pharmacy.  Patient checked while on the phone with this RN and verified that they were at his pharmacy now.  Answer Assessment - Initial Assessment Questions 1. REASON FOR CALL or QUESTION: "What is your reason for calling today?" or "How can I best help you?" or "What question do you have that I can  help answer?"     Checking on medication refills prior to going out of town on Monday  Protocols used: Information Only Call - No Triage-[facility]

## 2024-02-22 ENCOUNTER — Ambulatory Visit: Payer: Self-pay

## 2024-02-22 VITALS — BP 136/80 | HR 71 | Ht 71.0 in | Wt 162.0 lb

## 2024-02-22 DIAGNOSIS — I10 Essential (primary) hypertension: Secondary | ICD-10-CM | POA: Diagnosis not present

## 2024-02-22 DIAGNOSIS — Z23 Encounter for immunization: Secondary | ICD-10-CM

## 2024-02-22 DIAGNOSIS — E785 Hyperlipidemia, unspecified: Secondary | ICD-10-CM

## 2024-02-22 DIAGNOSIS — N529 Male erectile dysfunction, unspecified: Secondary | ICD-10-CM | POA: Diagnosis not present

## 2024-02-22 DIAGNOSIS — Z125 Encounter for screening for malignant neoplasm of prostate: Secondary | ICD-10-CM

## 2024-02-22 MED ORDER — LOSARTAN POTASSIUM 100 MG PO TABS: 100.0000 mg | ORAL_TABLET | Freq: Every day | ORAL | 3 refills | Status: AC

## 2024-02-22 MED ORDER — SILDENAFIL CITRATE 100 MG PO TABS: 50.0000 mg | ORAL_TABLET | Freq: Every day | ORAL | 11 refills | Status: AC | PRN

## 2024-02-22 MED ORDER — ATORVASTATIN CALCIUM 20 MG PO TABS: 20.0000 mg | ORAL_TABLET | Freq: Every day | ORAL | 3 refills | Status: AC

## 2024-02-22 NOTE — Assessment & Plan Note (Signed)
 Currently on atorvastatin  20 mg daily Check lipid panel Recommend low-fat diet

## 2024-02-22 NOTE — Assessment & Plan Note (Signed)
 BP Readings from Last 3 Encounters:  02/22/24 136/80  06/18/23 (!) 147/84  08/20/22 108/64  Chronic condition well-controlled on losartan  100 mg daily Continue current medication DASH diet advised engage in regular daily exercises at least 150 minutes weekly CMP ordered

## 2024-02-22 NOTE — Assessment & Plan Note (Signed)
-  takes tadalafil 10 mg PRN

## 2024-02-22 NOTE — Progress Notes (Signed)
 Established Patient Office Visit  Subjective   Patient ID: Richard Matthews, male    DOB: 13-Oct-1966  Age: 58 y.o. MRN: [id number]  Chief Complaint  Patient presents with   Medical Management of Chronic Issues    Pt here for med check and refills    HPI  Patient Active Problem List   Diagnosis Date Noted   Tear of medial meniscus of right knee, current    Acute pain of right knee 03/28/2022   Erectile dysfunction 05/09/2021   Encounter for general adult medical examination with abnormal findings 05/09/2021   HLD (hyperlipidemia) 05/09/2021   Essential hypertension 08/21/2020   Chronic sinusitis 06/13/2019   Past Medical History:  Diagnosis Date   Hypertension       ROS    Objective:     BP 136/80   Pulse 71   Ht 5\' 11"  (1.803 m)   Wt 162 lb 0.6 oz (73.5 kg)   SpO2 96%   BMI 22.60 kg/m  BP Readings from Last 3 Encounters:  02/22/24 136/80  06/18/23 (!) 147/84  08/20/22 108/64   Wt Readings from Last 3 Encounters:  02/22/24 162 lb 0.6 oz (73.5 kg)  06/18/23 163 lb 12.8 oz (74.3 kg)  08/20/22 160 lb (72.6 kg)      Physical Exam Vitals and nursing note reviewed.  Constitutional:      Appearance: Normal appearance.  HENT:     Head: Normocephalic.     Right Ear: Tympanic membrane, ear canal and external ear normal.     Left Ear: Tympanic membrane, ear canal and external ear normal.     Nose: Nose normal.     Mouth/Throat:     Mouth: Mucous membranes are moist.     Pharynx: Oropharynx is clear.  Cardiovascular:     Rate and Rhythm: Normal rate and regular rhythm.  Pulmonary:     Effort: Pulmonary effort is normal.     Breath sounds: Normal breath sounds.  Musculoskeletal:     Cervical back: Normal range of motion and neck supple.  Skin:    General: Skin is warm and dry.  Neurological:     Mental Status: He is alert and oriented to person, place, and time.  Psychiatric:        Mood and Affect: Mood normal.        Thought Content: Thought  content normal.      No results found for any visits on 02/22/24.    The 10-year ASCVD risk score (Arnett DK, et al., 2019) is: 6.6%    Assessment & Plan:   Problem List Items Addressed This Visit       Cardiovascular and Mediastinum   Essential hypertension   BP Readings from Last 3 Encounters:  02/22/24 136/80  06/18/23 (!) 147/84  08/20/22 108/64  Chronic condition well-controlled on losartan  100 mg daily Continue current medication DASH diet advised engage in regular daily exercises at least 150 minutes weekly CMP ordered      Relevant Medications   sildenafil  (VIAGRA ) 100 MG tablet   atorvastatin  (LIPITOR) 20 MG tablet   losartan  (COZAAR ) 100 MG tablet     Other   Erectile dysfunction   -takes tadalafil  10 mg PRN      Relevant Medications   sildenafil  (VIAGRA ) 100 MG tablet   HLD (hyperlipidemia) - Primary   Currently on atorvastatin  20 mg daily Check lipid panel Recommend low-fat diet      Relevant Medications   sildenafil  (VIAGRA )  100 MG tablet   atorvastatin  (LIPITOR) 20 MG tablet   losartan  (COZAAR ) 100 MG tablet   Other Relevant Orders   CMP14+EGFR   Lipid Profile   TSH + free T4   Other Visit Diagnoses       Prostate cancer screening       Relevant Orders   PSA     Need for Tdap vaccination       Relevant Orders   Tdap vaccine greater than or equal to 7yo IM     Encounter for immunization       Relevant Orders   Tdap vaccine greater than or equal to 7yo IM (Completed)       No follow-ups on file.    Alison Irvine, FNP

## 2024-02-23 LAB — CMP14+EGFR
ALT: 32 IU/L (ref 0–44)
AST: 38 IU/L (ref 0–40)
Albumin: 4.4 g/dL (ref 3.8–4.9)
Alkaline Phosphatase: 70 IU/L (ref 44–121)
BUN/Creatinine Ratio: 11 (ref 9–20)
BUN: 11 mg/dL (ref 6–24)
Bilirubin Total: 0.5 mg/dL (ref 0.0–1.2)
CO2: 24 mmol/L (ref 20–29)
Calcium: 9.8 mg/dL (ref 8.7–10.2)
Chloride: 100 mmol/L (ref 96–106)
Creatinine, Ser: 1.02 mg/dL (ref 0.76–1.27)
Globulin, Total: 2.1 g/dL (ref 1.5–4.5)
Glucose: 85 mg/dL (ref 70–99)
Potassium: 4.9 mmol/L (ref 3.5–5.2)
Sodium: 140 mmol/L (ref 134–144)
Total Protein: 6.5 g/dL (ref 6.0–8.5)
eGFR: 85 mL/min/{1.73_m2} (ref >59)

## 2024-02-23 LAB — TSH+FREE T4
Free T4: 1.2 ng/dL (ref 0.82–1.77)
TSH: 1.83 u[IU]/mL (ref 0.450–4.500)

## 2024-02-23 LAB — LIPID PANEL
Chol/HDL Ratio: 2.9 ratio (ref 0.0–5.0)
Cholesterol, Total: 156 mg/dL (ref 100–199)
HDL: 53 mg/dL (ref >39)
LDL Chol Calc (NIH): 80 mg/dL (ref 0–99)
Triglycerides: 130 mg/dL (ref 0–149)
VLDL Cholesterol Cal: 23 mg/dL (ref 5–40)

## 2024-02-23 LAB — PSA: Prostate Specific Ag, Serum: 2 ng/mL (ref 0.0–4.0)

## 2024-03-16 ENCOUNTER — Ambulatory Visit: Payer: Self-pay | Admitting: Internal Medicine
